# Patient Record
Sex: Female | Born: 1950 | Race: Black or African American | Hispanic: No | Marital: Married | Smoking: Former smoker
Health system: Southern US, Community
[De-identification: ages and names within clinical notes are randomized; demographics above are authoritative.]

## PROBLEM LIST (undated history)

## (undated) DIAGNOSIS — J189 Pneumonia, unspecified organism: Secondary | ICD-10-CM

## (undated) DIAGNOSIS — Z973 Presence of spectacles and contact lenses: Secondary | ICD-10-CM

## (undated) DIAGNOSIS — K219 Gastro-esophageal reflux disease without esophagitis: Secondary | ICD-10-CM

## (undated) DIAGNOSIS — I10 Essential (primary) hypertension: Secondary | ICD-10-CM

## (undated) DIAGNOSIS — E785 Hyperlipidemia, unspecified: Secondary | ICD-10-CM

## (undated) DIAGNOSIS — M5136 Other intervertebral disc degeneration, lumbar region: Secondary | ICD-10-CM

## (undated) DIAGNOSIS — M4316 Spondylolisthesis, lumbar region: Secondary | ICD-10-CM

## (undated) DIAGNOSIS — M069 Rheumatoid arthritis, unspecified: Secondary | ICD-10-CM

## (undated) DIAGNOSIS — Z972 Presence of dental prosthetic device (complete) (partial): Secondary | ICD-10-CM

## (undated) DIAGNOSIS — R011 Cardiac murmur, unspecified: Secondary | ICD-10-CM

## (undated) HISTORY — PX: COLONOSCOPY: SHX174

## (undated) HISTORY — PX: LAPAROSCOPY FOR ECTOPIC PREGNANCY: SUR765

## (undated) HISTORY — PX: THYROID SURGERY: SHX805

## (undated) HISTORY — DX: Hyperlipidemia, unspecified: E78.5

## (undated) HISTORY — PX: MULTIPLE TOOTH EXTRACTIONS: SHX2053

## (undated) HISTORY — PX: TUBAL LIGATION: SHX77

## (undated) HISTORY — PX: BREAST SURGERY: SHX581

## (undated) HISTORY — PX: DIAGNOSTIC LAPAROSCOPY: SUR761

---

## 1997-08-30 ENCOUNTER — Encounter: Admission: RE | Admit: 1997-08-30 | Discharge: 1997-08-30 | Payer: Self-pay | Admitting: *Deleted

## 1997-09-04 ENCOUNTER — Ambulatory Visit: Admission: RE | Admit: 1997-09-04 | Discharge: 1997-09-04 | Payer: Self-pay | Admitting: Internal Medicine

## 1999-09-02 ENCOUNTER — Ambulatory Visit (HOSPITAL_COMMUNITY): Admission: RE | Admit: 1999-09-02 | Discharge: 1999-09-02 | Payer: Self-pay | Admitting: Orthopedic Surgery

## 1999-09-02 ENCOUNTER — Encounter: Payer: Self-pay | Admitting: Orthopedic Surgery

## 1999-09-16 ENCOUNTER — Ambulatory Visit (HOSPITAL_COMMUNITY): Admission: RE | Admit: 1999-09-16 | Discharge: 1999-09-16 | Payer: Self-pay | Admitting: Orthopedic Surgery

## 1999-09-16 ENCOUNTER — Encounter: Payer: Self-pay | Admitting: Orthopedic Surgery

## 1999-10-01 ENCOUNTER — Ambulatory Visit (HOSPITAL_COMMUNITY): Admission: RE | Admit: 1999-10-01 | Discharge: 1999-10-01 | Payer: Self-pay | Admitting: Orthopedic Surgery

## 1999-10-01 ENCOUNTER — Encounter: Payer: Self-pay | Admitting: Orthopedic Surgery

## 2000-04-21 ENCOUNTER — Other Ambulatory Visit: Admission: RE | Admit: 2000-04-21 | Discharge: 2000-04-21 | Payer: Self-pay | Admitting: Family Medicine

## 2000-06-16 ENCOUNTER — Encounter: Payer: Self-pay | Admitting: Family Medicine

## 2000-06-16 ENCOUNTER — Ambulatory Visit (HOSPITAL_COMMUNITY): Admission: RE | Admit: 2000-06-16 | Discharge: 2000-06-16 | Payer: Self-pay | Admitting: Family Medicine

## 2000-12-15 ENCOUNTER — Emergency Department (HOSPITAL_COMMUNITY): Admission: EM | Admit: 2000-12-15 | Discharge: 2000-12-15 | Payer: Self-pay | Admitting: Emergency Medicine

## 2002-09-26 ENCOUNTER — Encounter: Admission: RE | Admit: 2002-09-26 | Discharge: 2002-09-26 | Payer: Self-pay | Admitting: *Deleted

## 2002-09-26 ENCOUNTER — Encounter: Payer: Self-pay | Admitting: *Deleted

## 2002-11-27 ENCOUNTER — Ambulatory Visit (HOSPITAL_COMMUNITY): Admission: RE | Admit: 2002-11-27 | Discharge: 2002-11-27 | Payer: Self-pay | Admitting: Rheumatology

## 2003-06-09 ENCOUNTER — Emergency Department (HOSPITAL_COMMUNITY): Admission: EM | Admit: 2003-06-09 | Discharge: 2003-06-10 | Payer: Self-pay | Admitting: Emergency Medicine

## 2003-09-06 ENCOUNTER — Other Ambulatory Visit: Admission: RE | Admit: 2003-09-06 | Discharge: 2003-09-06 | Payer: Self-pay | Admitting: Family Medicine

## 2003-09-27 ENCOUNTER — Ambulatory Visit (HOSPITAL_COMMUNITY): Admission: RE | Admit: 2003-09-27 | Discharge: 2003-09-27 | Payer: Self-pay | Admitting: Gastroenterology

## 2003-09-27 ENCOUNTER — Encounter (INDEPENDENT_AMBULATORY_CARE_PROVIDER_SITE_OTHER): Payer: Self-pay | Admitting: Specialist

## 2006-10-28 ENCOUNTER — Encounter: Admission: RE | Admit: 2006-10-28 | Discharge: 2006-10-28 | Payer: Self-pay | Admitting: Endocrinology

## 2007-01-10 ENCOUNTER — Encounter: Admission: RE | Admit: 2007-01-10 | Discharge: 2007-01-10 | Payer: Self-pay | Admitting: Surgery

## 2007-03-01 ENCOUNTER — Encounter: Admission: RE | Admit: 2007-03-01 | Discharge: 2007-03-01 | Payer: Self-pay | Admitting: Rheumatology

## 2007-03-07 ENCOUNTER — Encounter (INDEPENDENT_AMBULATORY_CARE_PROVIDER_SITE_OTHER): Payer: Self-pay | Admitting: Surgery

## 2007-03-07 ENCOUNTER — Ambulatory Visit (HOSPITAL_COMMUNITY): Admission: RE | Admit: 2007-03-07 | Discharge: 2007-03-08 | Payer: Self-pay | Admitting: Surgery

## 2008-12-06 ENCOUNTER — Encounter: Admission: RE | Admit: 2008-12-06 | Discharge: 2008-12-06 | Payer: Self-pay | Admitting: Neurosurgery

## 2009-02-21 ENCOUNTER — Ambulatory Visit (HOSPITAL_COMMUNITY): Admission: RE | Admit: 2009-02-21 | Discharge: 2009-02-21 | Payer: Self-pay | Admitting: Gastroenterology

## 2009-08-21 ENCOUNTER — Encounter: Admission: RE | Admit: 2009-08-21 | Discharge: 2009-08-21 | Payer: Self-pay | Admitting: Family Medicine

## 2009-10-21 ENCOUNTER — Other Ambulatory Visit: Admission: RE | Admit: 2009-10-21 | Discharge: 2009-10-21 | Payer: Self-pay | Admitting: Family Medicine

## 2010-02-16 ENCOUNTER — Encounter: Payer: Self-pay | Admitting: Rheumatology

## 2010-06-10 NOTE — Op Note (Signed)
Michelle Castaneda, Michelle Castaneda               ACCOUNT NO.:  [id number]   MEDICAL RECORD NO.:  [id number]          PATIENT TYPE:  AMB   LOCATION:  DAY                          FACILITY:  [facility]   PHYSICIAN:  Velora Heckler, MD      DATE OF BIRTH:  05-Sep-1950   DATE OF PROCEDURE:  03/07/2007  DATE OF DISCHARGE:                               OPERATIVE REPORT   PREOPERATIVE DIAGNOSIS:  Primary hyperparathyroidism.   POSTOPERATIVE DIAGNOSIS:  Primary hyperparathyroidism.   PROCEDURE:  1. Neck exploration.  2. Parathyroidectomy (2-1/2 glands).   SURGEON:  Velora Heckler, MD, FACS   ASSISTANT:  Leonie Man, MD   ANESTHESIA:  General.   ANESTHESIOLOGIST:  Eilene Ghazi, MD   ESTIMATED BLOOD LOSS:  Minimal.   PREPARATION:  Betadine.   COMPLICATIONS:  None.   INDICATIONS:  The patient is a 60 year old black female from [place],  [place].  She is referred by Dr. Dorisann Frames with primary  hyperparathyroidism.  Calcium levels have ranged from 10.2 to 10.9.  Intact parathyroid hormone level has ranged from 104-106.  Twenty-four-  hour urine collection for calcium was obtained and was elevated at 480.  The patient now comes to surgery for parathyroidectomy.  Sestamibi scan  and MRI scan failed to localize adenoma.   BODY OF REPORT:  Procedure was done in OR #11 at the [facility].  The patient was brought to the operating room and  placed in a supine position on the operating room table.  Following the  administration of general anesthesia, the patient is positioned and then  prepped and draped in the usual strict aseptic fashion.  After  ascertaining that an adequate level of anesthesia had been achieved, a  Kocher incision is made with a #15 blade.  Dissection was carried  through subcutaneous tissues and platysma.  Hemostasis was obtained with  the electrocautery.  Skin flaps were elevated cephalad and caudad from  the thyroid notch to the sternal notch.  A  Mahorner self-retaining  retractor was placed for exposure.  Strap muscles were incised in the  midline and dissection was begun on the left side of the neck.  There  was an obvious nodule in the inferior pole of the left thyroid lobe;  this was excised.  Frozen section confirms thyroid tissue.  Further  dissection on the left reveals an abnormally prominent thyrothymic  tract; this was gently dissected out.  There also appears to be a normal  parathyroid gland in the left inferior position.  Exploration of the  left superior position, fails to identify abnormal parathyroid gland.  Area was widely opened including the hypopharynx, the esophagus and all  the way back to the precervical fascia.  Superior pole was skeletonized.  There was a colloid cyst in the superior pole which was ruptured and  drained.   Next, we turned our attention to the right side.  Again, right side  exploration reveals a prominent thyrothymic tract; this was dissected  out and contained what appeared to be an enlarged parathyroid gland.  This was resected between medium  Ligaclips.  It was submitted to  Pathology.  Dr. Esther Hardy confirms parathyroid tissue that appears  hyperplastic, but does not appear to be an adenoma.  Further exploration  along the right neck reveals a right superior gland which is enlarged.  It is just above the level of the inferior thyroid artery in the  tracheoesophageal groove.  It is resected with the vascular pedicle  divided between Ligaclips.  It is submitted to Pathology and again  reveals hyperplastic parathyroid tissue, but without evidence of  adenoma.  A dry pack was placed in the right neck and further  exploration was performed on the left.  Again, no enlarged parathyroid  gland was identified.  However, the nodule in the left inferior position  had become somewhat bruised, consistent with parathyroid tissue.  Decision was made not to biopsy this tissue due to having excised  2  glands from the right.  The thyrothymic tract was excised on the left.  It was opened and there was tissue that appeared to be parathyroid.  This was divided in half and half of the tissue was submitted for frozen  section, which confirms parathyroid tissue which was again hyperplastic.  The remaining tissue was diced into small 1-mm fragments and then  inserted into a muscular cavity and the left sternocleidomastoid muscle.  Muscle was closed over the fragments with an interrupted 3-0 Vicryl  figure-of-eight suture.  Prolene suture was then placed cephalad and  caudad of the closure on the surface of the sternocleidomastoid muscle  for localization purposes at a later date if necessary.  Good hemostasis  was noted throughout the neck.  Surgicel was placed in the operative  field.  Strap muscles were reapproximated in the midline with  interrupted 3-0 Vicryl sutures.  Platysma was closed with interrupted 3-  0 Vicryl sutures.  Skin was closed with a running 4-0 Monocryl  subcuticular suture.  Wound was washed and dried and Benzoin and Steri-  Strips were applied.  Sterile dressings were applied.  The patient was  awakened from anesthesia and brought to the recovery room in stable  condition.  The patient tolerated the procedure well.      Velora Heckler, MD  Electronically Signed     TMG/MEDQ  D:  03/07/2007  T:  03/08/2007  Job:  [id number]   cc:   Dorisann Frames, M.D.  Fax: [phone]   Demetria Pore. Coral Spikes, M.D.  Fax: [phone]   C. Duane Lope, M.D.  Fax: [phone]

## 2010-06-13 NOTE — Op Note (Signed)
Michelle Castaneda, Michelle Castaneda                         ACCOUNT NO.:  [id number]   MEDICAL RECORD NO.:  [id number]                   PATIENT TYPE:  AMB   LOCATION:  ENDO                                 FACILITY:  [facility]   PHYSICIAN:  Danise Edge, M.D.                DATE OF BIRTH:  May 31, 1950   DATE OF PROCEDURE:  09/27/2003  DATE OF DISCHARGE:                                 OPERATIVE REPORT   PROCEDURE:  Colonoscopy and polypectomy.   PROCEDURE INDICATION:  Ms. Michelle Castaneda is a 60 year old female, born  08-24-1950.  Michelle Castaneda has intermittent, painless hematochezia and  is undergoing diagnostic colonoscopy for evaluation.   ENDOSCOPIST:  Danise Edge, M.D.   PREMEDICATION:  1.  Demerol 60 mg.  2.  Versed 6 mg.   DESCRIPTION OF PROCEDURE:  After obtaining informed consent, Michelle Castaneda  was placed in the left lateral decubitus position.  I administered  intravenous Demerol and intravenous Versed to achieve conscious sedation for  the procedure.  The patient's blood pressure, oxygen saturation, and cardiac  rhythm were monitored throughout the procedure and documented in the medical  record.   Anal inspection and digital rectal exam was normal.  The Olympus pediatric  adjustable colonoscope was introduced into the rectum and advanced to the  cecum.  Colonic preparation for the exam today was excellent.   RECTUM:  The rectal mucosa appears normal.  Retroflexed view of the distal  rectum reveals a prominent, nonbleeding internal hemorrhoid which is  probably the source of her intermittent painless hematochezia.  SIGMOID COLON AND DESCENDING COLON:  Normal.  SPLENIC FLEXURE:  Normal.  TRANSVERSE COLON:  From the proximal transverse colon, a 1 mm sessile polyp  was removed with the cold biopsy forceps.  HEPATIC FLEXURE:  Normal.  ASCENDING COLON:  Normal.  CECUM AND ILEOCECAL VALVE:  From the proximal cecum, adjacent to the  appendiceal orifice, a 1 mm sessile polyp was  removed with the cold biopsy  forceps.   ASSESSMENT:  1.  Prominent internal hemorrhoid which is most likely the source of Ms.      Castaneda's intermittent hematochezia.  2.  A small polyp was removed from the proximal transverse colon, and a      small polyp was removed from the cecum.                                               Danise Edge, M.D.    MJ/MEDQ  D:  09/27/2003  T:  09/27/2003  Job:  [id number]   cc:   C. Duane Lope, M.D.  [address]  [place]  [place] [address]  Fax: [phone]   Demetria Pore. Coral Spikes, M.D.  [address]  [address]  [address]  [place] [address]  Fax: [phone]

## 2010-10-17 LAB — URINALYSIS, ROUTINE W REFLEX MICROSCOPIC
Bilirubin Urine: NEGATIVE
Glucose, UA: NEGATIVE
Ketones, ur: NEGATIVE
Nitrite: NEGATIVE
Specific Gravity, Urine: 1.015

## 2010-10-17 LAB — BASIC METABOLIC PANEL
BUN: 16
Chloride: 107
Creatinine, Ser: 0.74
GFR calc Af Amer: >60
GFR calc non Af Amer: >60

## 2010-10-17 LAB — DIFFERENTIAL
Eosinophils Relative: 2
Lymphocytes Relative: 40
Lymphs Abs: 1.6
Monocytes Absolute: 0.3

## 2010-10-17 LAB — CBC
Hemoglobin: 13
MCHC: 34
RDW: 13
WBC: 4.1

## 2011-02-16 ENCOUNTER — Other Ambulatory Visit: Payer: Self-pay | Admitting: Family Medicine

## 2011-02-16 DIAGNOSIS — N644 Mastodynia: Secondary | ICD-10-CM

## 2011-02-23 ENCOUNTER — Ambulatory Visit
Admission: RE | Admit: 2011-02-23 | Discharge: 2011-02-23 | Disposition: A | Discharge status: Home / Self Care | Payer: BC Managed Care – PPO | Source: Ambulatory Visit | Attending: Family Medicine | Admitting: Family Medicine

## 2011-02-23 DIAGNOSIS — N644 Mastodynia: Secondary | ICD-10-CM

## 2012-11-14 ENCOUNTER — Other Ambulatory Visit: Payer: Self-pay

## 2012-11-14 DIAGNOSIS — Z1231 Encounter for screening mammogram for malignant neoplasm of breast: Secondary | ICD-10-CM

## 2012-11-16 ENCOUNTER — Ambulatory Visit
Admission: RE | Admit: 2012-11-16 | Discharge: 2012-11-16 | Disposition: A | Discharge status: Home / Self Care | Payer: BC Managed Care – PPO | Source: Ambulatory Visit

## 2012-11-16 DIAGNOSIS — Z1231 Encounter for screening mammogram for malignant neoplasm of breast: Secondary | ICD-10-CM

## 2013-03-06 ENCOUNTER — Other Ambulatory Visit: Payer: Self-pay | Admitting: Family Medicine

## 2013-03-06 ENCOUNTER — Other Ambulatory Visit (HOSPITAL_COMMUNITY)
Admission: RE | Admit: 2013-03-06 | Discharge: 2013-03-06 | Disposition: A | Discharge status: Home / Self Care | Payer: BC Managed Care – PPO | Source: Ambulatory Visit | Attending: Family Medicine | Admitting: Family Medicine

## 2013-03-06 DIAGNOSIS — Z01419 Encounter for gynecological examination (general) (routine) without abnormal findings: Secondary | ICD-10-CM | POA: Insufficient documentation

## 2013-11-07 ENCOUNTER — Other Ambulatory Visit: Payer: Self-pay

## 2013-11-07 DIAGNOSIS — Z1231 Encounter for screening mammogram for malignant neoplasm of breast: Secondary | ICD-10-CM

## 2013-11-21 ENCOUNTER — Ambulatory Visit
Admission: RE | Admit: 2013-11-21 | Discharge: 2013-11-21 | Disposition: A | Discharge status: Home / Self Care | Payer: BC Managed Care – PPO | Source: Ambulatory Visit

## 2013-11-21 ENCOUNTER — Encounter (INDEPENDENT_AMBULATORY_CARE_PROVIDER_SITE_OTHER): Payer: Self-pay

## 2013-11-21 DIAGNOSIS — Z1231 Encounter for screening mammogram for malignant neoplasm of breast: Secondary | ICD-10-CM

## 2014-10-03 DIAGNOSIS — M2550 Pain in unspecified joint: Secondary | ICD-10-CM | POA: Diagnosis not present

## 2014-10-03 DIAGNOSIS — M0579 Rheumatoid arthritis with rheumatoid factor of multiple sites without organ or systems involvement: Secondary | ICD-10-CM | POA: Diagnosis not present

## 2014-10-03 DIAGNOSIS — Z79899 Other long term (current) drug therapy: Secondary | ICD-10-CM | POA: Diagnosis not present

## 2015-01-02 DIAGNOSIS — M0579 Rheumatoid arthritis with rheumatoid factor of multiple sites without organ or systems involvement: Secondary | ICD-10-CM | POA: Diagnosis not present

## 2015-01-02 DIAGNOSIS — M2550 Pain in unspecified joint: Secondary | ICD-10-CM | POA: Diagnosis not present

## 2015-01-02 DIAGNOSIS — Z79899 Other long term (current) drug therapy: Secondary | ICD-10-CM | POA: Diagnosis not present

## 2015-01-02 DIAGNOSIS — M25431 Effusion, right wrist: Secondary | ICD-10-CM | POA: Diagnosis not present

## 2015-01-22 ENCOUNTER — Other Ambulatory Visit: Payer: Self-pay

## 2015-01-22 DIAGNOSIS — Z1231 Encounter for screening mammogram for malignant neoplasm of breast: Secondary | ICD-10-CM

## 2015-02-05 ENCOUNTER — Ambulatory Visit
Admission: RE | Admit: 2015-02-05 | Discharge: 2015-02-05 | Disposition: A | Discharge status: Home / Self Care | Payer: Medicare Other | Source: Ambulatory Visit

## 2015-02-05 DIAGNOSIS — Z1231 Encounter for screening mammogram for malignant neoplasm of breast: Secondary | ICD-10-CM

## 2015-02-06 ENCOUNTER — Other Ambulatory Visit: Payer: Self-pay | Admitting: Family Medicine

## 2015-02-06 DIAGNOSIS — R928 Other abnormal and inconclusive findings on diagnostic imaging of breast: Secondary | ICD-10-CM

## 2015-02-08 ENCOUNTER — Ambulatory Visit
Admission: RE | Admit: 2015-02-08 | Discharge: 2015-02-08 | Disposition: A | Discharge status: Home / Self Care | Payer: Medicare Other | Source: Ambulatory Visit | Attending: Family Medicine | Admitting: Family Medicine

## 2015-02-08 ENCOUNTER — Other Ambulatory Visit: Payer: Self-pay | Admitting: Family Medicine

## 2015-02-08 DIAGNOSIS — R921 Mammographic calcification found on diagnostic imaging of breast: Secondary | ICD-10-CM

## 2015-02-08 DIAGNOSIS — R928 Other abnormal and inconclusive findings on diagnostic imaging of breast: Secondary | ICD-10-CM

## 2015-02-11 ENCOUNTER — Other Ambulatory Visit: Payer: Self-pay | Admitting: Family Medicine

## 2015-02-11 ENCOUNTER — Ambulatory Visit
Admission: RE | Admit: 2015-02-11 | Discharge: 2015-02-11 | Disposition: A | Discharge status: Home / Self Care | Payer: Medicare Other | Source: Ambulatory Visit | Attending: Family Medicine | Admitting: Family Medicine

## 2015-02-11 DIAGNOSIS — R921 Mammographic calcification found on diagnostic imaging of breast: Secondary | ICD-10-CM

## 2015-04-02 ENCOUNTER — Other Ambulatory Visit: Payer: Self-pay | Admitting: Obstetrics and Gynecology

## 2015-04-02 ENCOUNTER — Other Ambulatory Visit (HOSPITAL_COMMUNITY)
Admission: RE | Admit: 2015-04-02 | Discharge: 2015-04-02 | Disposition: A | Discharge status: Home / Self Care | Payer: Medicare Other | Source: Ambulatory Visit | Attending: Obstetrics and Gynecology | Admitting: Obstetrics and Gynecology

## 2015-04-02 DIAGNOSIS — Z1151 Encounter for screening for human papillomavirus (HPV): Secondary | ICD-10-CM | POA: Diagnosis present

## 2015-04-02 DIAGNOSIS — Z01419 Encounter for gynecological examination (general) (routine) without abnormal findings: Secondary | ICD-10-CM | POA: Insufficient documentation

## 2015-04-03 LAB — CYTOLOGY - PAP

## 2015-04-24 ENCOUNTER — Other Ambulatory Visit: Payer: Self-pay | Admitting: Obstetrics and Gynecology

## 2015-05-07 NOTE — Patient Instructions (Addendum)
Your procedure is scheduled on:  Thursday, May 30, 2015  Enter through the [employer] of [facility] at: 9:00 AM  Pick up the phone at the desk and dial [phone].  Call this number if you have problems the morning of surgery: [phone].  Remember: Do NOT eat food or drink after: Midnight Wednesday, May 29, 2015  Take these medicines the morning of surgery with a SIP OF WATER:  Hydroxychloroquine, Leflunomide  Do NOT wear jewelry (body piercing), metal hair clips/bobby pins, make-up, or nail polish. Do NOT wear lotions, powders, or perfumes.  You may wear deodorant. Do NOT shave for 48 hours prior to surgery. Do NOT bring valuables to the hospital. Contacts, dentures, or bridgework may not be worn into surgery.  Have a responsible adult drive you home and stay with you for 24 hours after your procedure

## 2015-05-08 ENCOUNTER — Other Ambulatory Visit (HOSPITAL_COMMUNITY): Payer: Self-pay | Admitting: Obstetrics and Gynecology

## 2015-05-09 ENCOUNTER — Encounter (HOSPITAL_COMMUNITY)
Admission: RE | Admit: 2015-05-09 | Discharge: 2015-05-09 | Disposition: A | Discharge status: Home / Self Care | Payer: Medicare Other | Source: Ambulatory Visit | Attending: Obstetrics and Gynecology | Admitting: Obstetrics and Gynecology

## 2015-05-09 ENCOUNTER — Other Ambulatory Visit: Payer: Self-pay

## 2015-05-09 ENCOUNTER — Encounter (HOSPITAL_COMMUNITY): Payer: Self-pay

## 2015-05-09 DIAGNOSIS — Z01818 Encounter for other preprocedural examination: Secondary | ICD-10-CM | POA: Diagnosis present

## 2015-05-09 HISTORY — DX: Other intervertebral disc degeneration, lumbar region: M51.36

## 2015-05-09 HISTORY — DX: Cardiac murmur, unspecified: R01.1

## 2015-05-09 HISTORY — DX: Gastro-esophageal reflux disease without esophagitis: K21.9

## 2015-05-09 HISTORY — DX: Rheumatoid arthritis, unspecified: M06.9

## 2015-05-09 LAB — CBC
HCT: 41.6 % (ref 36.0–46.0)
HEMOGLOBIN: 13.7 g/dL (ref 12.0–15.0)
MCH: 29.3 pg (ref 26.0–34.0)
MCHC: 32.9 g/dL (ref 30.0–36.0)
MCV: 88.9 fL (ref 78.0–100.0)
Platelets: 171 10*3/uL (ref 150–400)
RBC: 4.68 MIL/uL (ref 3.87–5.11)
RDW: 13.2 % (ref 11.5–15.5)
WBC: 3.6 10*3/uL — ABNORMAL LOW (ref 4.0–10.5)

## 2015-05-09 NOTE — Pre-Procedure Instructions (Signed)
Dr. Cristela Blue reviewed EKG and informed of patient's elevated blood pressures, no new orders received at this time.

## 2015-05-30 ENCOUNTER — Encounter (HOSPITAL_COMMUNITY)
Admission: RE | Disposition: A | Discharge status: Home / Self Care | Payer: Self-pay | Source: Ambulatory Visit | Attending: Obstetrics and Gynecology

## 2015-05-30 ENCOUNTER — Ambulatory Visit (HOSPITAL_COMMUNITY)
Admission: RE | Admit: 2015-05-30 | Discharge: 2015-05-30 | Disposition: A | Discharge status: Home / Self Care | Payer: Medicare Other | Source: Ambulatory Visit | Attending: Obstetrics and Gynecology | Admitting: Obstetrics and Gynecology

## 2015-05-30 ENCOUNTER — Other Ambulatory Visit (HOSPITAL_COMMUNITY): Payer: Self-pay | Admitting: Obstetrics and Gynecology

## 2015-05-30 ENCOUNTER — Encounter (HOSPITAL_COMMUNITY): Payer: Self-pay | Admitting: Registered Nurse

## 2015-05-30 ENCOUNTER — Ambulatory Visit (HOSPITAL_COMMUNITY): Payer: Medicare Other | Admitting: Anesthesiology

## 2015-05-30 DIAGNOSIS — Z87891 Personal history of nicotine dependence: Secondary | ICD-10-CM | POA: Diagnosis not present

## 2015-05-30 DIAGNOSIS — N950 Postmenopausal bleeding: Secondary | ICD-10-CM | POA: Diagnosis present

## 2015-05-30 DIAGNOSIS — N840 Polyp of corpus uteri: Secondary | ICD-10-CM | POA: Diagnosis not present

## 2015-05-30 DIAGNOSIS — K219 Gastro-esophageal reflux disease without esophagitis: Secondary | ICD-10-CM | POA: Diagnosis not present

## 2015-05-30 DIAGNOSIS — Z6832 Body mass index (BMI) 32.0-32.9, adult: Secondary | ICD-10-CM | POA: Diagnosis not present

## 2015-05-30 HISTORY — PX: CERVICAL POLYPECTOMY: SHX88

## 2015-05-30 HISTORY — PX: DILATATION & CURETTAGE/HYSTEROSCOPY WITH MYOSURE: SHX6511

## 2015-05-30 SURGERY — DILATATION & CURETTAGE/HYSTEROSCOPY WITH MYOSURE
Anesthesia: General

## 2015-05-30 MED ORDER — BUPIVACAINE HCL (PF) 0.25 % IJ SOLN
INTRAMUSCULAR | Status: AC
  Filled 2015-05-30: qty 30

## 2015-05-30 MED ORDER — MIDAZOLAM HCL 2 MG/2ML IJ SOLN
0.5000 mg | Freq: Once | INTRAMUSCULAR | Status: DC | PRN
Start: 2015-05-30 — End: 2015-05-30

## 2015-05-30 MED ORDER — LACTATED RINGERS IV SOLN
INTRAVENOUS | Status: DC
  Administered 2015-05-30 (×2): via INTRAVENOUS

## 2015-05-30 MED ORDER — KETOROLAC TROMETHAMINE 30 MG/ML IJ SOLN
INTRAMUSCULAR | Status: DC | PRN
  Administered 2015-05-30: 30 mg via INTRAVENOUS

## 2015-05-30 MED ORDER — PROPOFOL 10 MG/ML IV BOLUS
INTRAVENOUS | Status: DC | PRN
  Administered 2015-05-30: 150 mg via INTRAVENOUS

## 2015-05-30 MED ORDER — SCOPOLAMINE 1 MG/3DAYS TD PT72: 1.0000 | MEDICATED_PATCH | Freq: Once | TRANSDERMAL | Status: DC

## 2015-05-30 MED ORDER — FENTANYL CITRATE (PF) 100 MCG/2ML IJ SOLN
INTRAMUSCULAR | Status: AC
  Filled 2015-05-30: qty 2

## 2015-05-30 MED ORDER — DEXAMETHASONE SODIUM PHOSPHATE 4 MG/ML IJ SOLN
INTRAMUSCULAR | Status: AC
Start: 2015-05-30 — End: 2015-05-30
  Filled 2015-05-30: qty 1

## 2015-05-30 MED ORDER — ONDANSETRON HCL 4 MG/2ML IJ SOLN
INTRAMUSCULAR | Status: AC
  Filled 2015-05-30: qty 2

## 2015-05-30 MED ORDER — MIDAZOLAM HCL 2 MG/2ML IJ SOLN
INTRAMUSCULAR | Status: AC
  Filled 2015-05-30: qty 2

## 2015-05-30 MED ORDER — IBUPROFEN 800 MG PO TABS: 800.0000 mg | ORAL_TABLET | Freq: Three times a day (TID) | ORAL | Status: AC | PRN

## 2015-05-30 MED ORDER — MEPERIDINE HCL 25 MG/ML IJ SOLN: 6.2500 mg | INTRAMUSCULAR | Status: DC | PRN

## 2015-05-30 MED ORDER — PROPOFOL 10 MG/ML IV BOLUS
INTRAVENOUS | Status: AC
  Filled 2015-05-30: qty 20

## 2015-05-30 MED ORDER — HYDROMORPHONE HCL 1 MG/ML IJ SOLN: 0.2500 mg | INTRAMUSCULAR | Status: DC | PRN

## 2015-05-30 MED ORDER — OXYCODONE-ACETAMINOPHEN 5-325 MG PO TABS: 1.0000 | ORAL_TABLET | ORAL | Status: DC | PRN

## 2015-05-30 MED ORDER — LIDOCAINE HCL (CARDIAC) 20 MG/ML IV SOLN
INTRAVENOUS | Status: AC
  Filled 2015-05-30: qty 5

## 2015-05-30 MED ORDER — SODIUM CHLORIDE 0.9 % IR SOLN
Status: DC | PRN
  Administered 2015-05-30: 3000 mL

## 2015-05-30 MED ORDER — FENTANYL CITRATE (PF) 100 MCG/2ML IJ SOLN
INTRAMUSCULAR | Status: DC | PRN
  Administered 2015-05-30: 25 ug via INTRAVENOUS
  Administered 2015-05-30: 50 ug via INTRAVENOUS
  Administered 2015-05-30: 25 ug via INTRAVENOUS

## 2015-05-30 MED ORDER — BUPIVACAINE HCL (PF) 0.25 % IJ SOLN
INTRAMUSCULAR | Status: DC | PRN
  Administered 2015-05-30: 20 mL

## 2015-05-30 MED ORDER — ONDANSETRON HCL 4 MG/2ML IJ SOLN
INTRAMUSCULAR | Status: DC | PRN
  Administered 2015-05-30: 4 mg via INTRAVENOUS

## 2015-05-30 MED ORDER — DEXAMETHASONE SODIUM PHOSPHATE 4 MG/ML IJ SOLN
INTRAMUSCULAR | Status: DC | PRN
  Administered 2015-05-30: 4 mg via INTRAVENOUS

## 2015-05-30 MED ORDER — LIDOCAINE HCL (CARDIAC) 20 MG/ML IV SOLN
INTRAVENOUS | Status: DC | PRN
  Administered 2015-05-30: 50 mg via INTRAVENOUS

## 2015-05-30 MED ORDER — MIDAZOLAM HCL 5 MG/5ML IJ SOLN
INTRAMUSCULAR | Status: DC | PRN
  Administered 2015-05-30: 2 mg via INTRAVENOUS

## 2015-05-30 MED ORDER — KETOROLAC TROMETHAMINE 30 MG/ML IJ SOLN
INTRAMUSCULAR | Status: AC
  Filled 2015-05-30: qty 1

## 2015-05-30 SURGICAL SUPPLY — 22 items
CANISTER SUCT 3000ML (MISCELLANEOUS) ×2 IMPLANT
CATH ROBINSON RED A/P 16FR (CATHETERS) ×2 IMPLANT
CLOTH BEACON ORANGE TIMEOUT ST (SAFETY) ×2 IMPLANT
CONTAINER PREFILL 10% NBF 60ML (FORM) ×3 IMPLANT
DEVICE MYOSURE LITE (MISCELLANEOUS) IMPLANT
DEVICE MYOSURE REACH (MISCELLANEOUS) ×1 IMPLANT
ELECT REM PT RETURN 9FT ADLT (ELECTROSURGICAL) ×2
ELECTRODE REM PT RTRN 9FT ADLT (ELECTROSURGICAL) ×1 IMPLANT
FILTER ARTHROSCOPY CONVERTOR (FILTER) ×2 IMPLANT
GLOVE BIOGEL M 6.5 STRL (GLOVE) ×4 IMPLANT
GLOVE BIOGEL PI IND STRL 6.5 (GLOVE) ×1 IMPLANT
GLOVE BIOGEL PI IND STRL 7.0 (GLOVE) ×1 IMPLANT
GLOVE BIOGEL PI INDICATOR 6.5 (GLOVE) ×1
GLOVE BIOGEL PI INDICATOR 7.0 (GLOVE) ×1
GOWN STRL REUS W/TWL LRG LVL3 (GOWN DISPOSABLE) ×4 IMPLANT
PACK VAGINAL MINOR WOMEN LF (CUSTOM PROCEDURE TRAY) ×2 IMPLANT
PAD OB MATERNITY 4.3X12.25 (PERSONAL CARE ITEMS) ×2 IMPLANT
SEAL ROD LENS SCOPE MYOSURE (ABLATOR) ×2 IMPLANT
TOWEL OR 17X24 6PK STRL BLUE (TOWEL DISPOSABLE) ×4 IMPLANT
TUBING AQUILEX INFLOW (TUBING) ×2 IMPLANT
TUBING AQUILEX OUTFLOW (TUBING) ×2 IMPLANT
WATER STERILE IRR 1000ML POUR (IV SOLUTION) ×1 IMPLANT

## 2015-05-30 NOTE — Discharge Instructions (Signed)

## 2015-05-30 NOTE — H&P (Signed)
Chief Complaint(s):   PreOp for 05/30/15   HPI:  General 65 y/o presents for preoperative history and physical in preparation for hysteroscopy D&C and possible polypectomy to evaluate and treat postmenopsuasl bleeding. Her ultrasound showed a 7.01x 4.2 xcm x 4.8 cm uterus the endometrium is thickened 1.87 cm. she has a submuocal fibroid that is 2.3 cm and an anterior left fiborid that is 2.8 cm. sh ehas had vaginal discharge that was bloody for several weeks. It is intermittnet. Endometrial biopsy performed 04/24/2015 revealed benign endometrial polyps. She denies pelvic pain. she has never had this before. she is sexually active no postcoital bleeding. she denies any discharge prior to thursday.  Current Medication:  Taking  Multivitamins Capsule as directed Orally     Leflunomide 20 MG Tablet 1 tab Orally Once a day, Notes: [place]     Hydroxychloroquine Sulfate 200 MG Tablet 1 tablet with food or milk Orally Once a day     B Complex - Capsule as directed Orally     Red Yeast Rice 600 MG Capsule Orally     Ibuprofen 800 MG Tablet 1 tablet Orally TID, Notes: prn     Medication List reviewed and reconciled with the patient   Medical History:   rheumatoid arthritis--RF+, CCP++, mild elevated ESR, clinical synovitis of hands/wrists, feet, leucopenia. MTX stopped for low WBC, remained low off all medications , Dr Trudie Reed     Parathyroidectomy     bicuspid aortic valve      Allergies/Intolerance:   Aspirin     Naproxen     shellfish - hives   Gyn History:   Sexual activity currently sexually active. Periods : postmenopausal, but recent spotting. LMP Mar 28, 2001. Last pap smear date 04/02/15. Last mammogram date 02/05/15. Denies Abnormal pap smear. Denies STD.   OB History:   Number of pregnancies 4. miscarriages 1. Pregnancy # 1 live birth, vaginal delivery, boy. Pregnancy # 2 live birth, vaginal delivery, boy. Pregnancy # 3 live birth, vaginal delivery, boy. Pregnancy # 4: miscarriage, tubal.     Surgical History:   thyroid nodule removed     Bx right breast - normal 02/11/2015   Hospitalization:   childbirth x 3     thyroid surgery   Family History:   Father: deceased, alcoholism    Mother: alive 25 yrs, osteoarthritis, hypercholesterolemia, diagnosed with DM, HTN, CAD    Paternal [place] Father: deceased    Paternal Eunice Mother: deceased    Maternal Grand Father: deceased, ADHD    Maternal Grand Mother: deceased    Brother 1: deceased 51 yrs, emphysema, diagnosed with COPD    Brother2: alive    Brother 3: deceased, MVA    7 brother(s) . 3 son(s) .    Significant GI family history: None Brother #4 deceased lung cancer 2014/03/28 Brother #5 alive Brother #6 deceased, bone cancer Brother #7 alive.  Social History:  General Tobacco use cigarettes: Former smoker, Quit in year 28-Mar-1998, Tobacco history last updated 05/20/2015.  no EXPOSURE TO PASSIVE SMOKE.  Alcohol: yes, Rare.  Caffeine: yes, coffee, tea, 2 servings daily.  no Recreational drug use, no.  no DIET, no particular dietary program, healthy choices.  Exercise: yes, tries to walk , less keeping GGChildren.  DENTAL CARE: good.  Marital Status: married, Married.  Children: 3 sons.  OCCUPATION: unemployed, retired.... .  Retired in June 2013 but wanting to go back to work, 6 [employer], 1 [place] none" options="no,yes" propid="91" itemid="[id number]" categoryid="10464" encounterid="[id number]"Fatigue none. none  today" options="no,yes" propid="91" itemid="10467" categoryid="10464" encounterid="[id number]"Fever none today.  CARDIOLOGY none" options="no,yes" propid="91" itemid="193603" categoryid="10488" encounterid="[id number]"Chest pain none.  RESPIRATORY no" options="no" propid="91" itemid="270013" categoryid="138132" encounterid="[id number]"Shortness of breath no. no" options="no,yes" propid="91" itemid="172745" categoryid="138132" encounterid="[id number]"Cough no.  GASTROENTEROLOGY none" options="no,yes"  propid="91" itemid="193447" categoryid="10494" encounterid="[id number]"Appetite change none. no" options="no,yes" propid="91" itemid="193449" categoryid="10494" encounterid="[id number]"Change in bowel habits no.  FEMALE REPRODUCTIVE no" options="no,yes" propid="91" itemid="196298" categoryid="10525" encounterid="[id number]"Breast lumps or discharge no. none" options="no,yes" propid="91" itemid="186083" categoryid="10525" encounterid="[id number]"Breast pain none. none" options="no,yes" propid="91" itemid="138198" categoryid="10525" encounterid="[id number]"Dyspareunia none. no" options="no,yes" propid="91" itemid="202654" categoryid="10525" encounterid="[id number]"Dysuria no. none" options="no,yes" propid="91" itemid="186082" categoryid="10525" encounterid="[id number]"Pelvic pain none. NA" options="no,yes" propid="91" itemid="199173" categoryid="10525" encounterid="[id number]"Regular menses NA. no" options="no,yes" propid="91" itemid="278230" categoryid="10525" encounterid="[id number]"Unusual vaginal discharge no. no" options="no,yes" propid="91" itemid="278942" categoryid="10525" encounterid="[id number]"Vaginal itching no. no" options="no,yes" propid="91" itemid="278837" categoryid="10525" encounterid="[id number]"Vulvar/labial lesion no.  NEUROLOGY none" options="no,yes" propid="91" itemid="193627" categoryid="12512" encounterid="[id number]"Migraines none. none" options="no,yes" propid="91" itemid="12514" categoryid="12512" encounterid="[id number]"Tingling/numbness none. none" options="no,yes" propid="91" itemid="193467" categoryid="12512" encounterid="[id number]"Visual changes none.  PSYCHOLOGY no" options="" propid="91" itemid="275919" categoryid="10520" encounterid="[id number]"Depression no.  SKIN no" options="no,yes" propid="91" itemid="269383" categoryid="202750" encounterid="[id number]"Rash no. no" options="no,yes" propid="91" itemid="202757" categoryid="202750" encounterid="[id number]"Suspicious lesions no.  ENDOCRINOLOGY none" options="no,yes"  propid="91" itemid="202624" categoryid="12508" encounterid="[id number]"Hot flashes none. no unintentional" options="no,yes" propid="91" itemid="193436" categoryid="12508" encounterid="[id number]"Weight gain no unintentional. none" options="no,yes" propid="91" itemid="138164" categoryid="12508" encounterid="[id number]"Weight loss none.  HEMATOLOGY/LYMPH no" options="no,yes" propid="91" itemid="193454" categoryid="138157" encounterid="[id number]"Anemia no.    Objective: Vitals:  Wt 199, Wt change 1 lb, Pulse sitting 70, BP sitting 133/77  Past Results:  Examination:  General Examination McCoy,Tiffany 05/20/2015 10:12:30 AM &gt; , for pelvic exam only" categoryPropId="[id number]" examid="[id number]"CHAPERONE PRESENT McCoy,Tiffany 05/20/2015 10:12:30 AM > , for pelvic exam only.  Physical Examination: GENERAL in NAD, pleasant"Patient appears in NAD, pleasant. well developed"Build: well developed. overweight"General Appearance: overweight. african-american"Race: african-american.  NECK unremarkable, no lymphadenopathy"Cervical lymph nodes: unremarkable, no lymphadenopathy. normal"ROM: normal. no thyromegaly, non tender"Thyroid: no thyromegaly, non tender.  LUNGS clear to auscultation"Breath sounds: clear to auscultation. no"Dyspnea: no.  HEART none"Murmurs: none. normal"Rate: normal. regular"Rhythm: regular.  ABDOMEN no masses,tenderness,organomegaly, no CVAT"General: no masses,tenderness,organomegaly, no CVAT.  FEMALE GENITOURINARY no mass, non tender"Adnexa: no mass, non tender. normal, no lesions"Anus/perineum: normal, no lesions. normal appearance , no lesions/discharge/bleeding, , good pelvic support , external os normal "Cervix/ cuff: normal appearance , no lesions/discharge/bleeding, , good pelvic support , external os normal . normal, no lesions, no skin discoloration, no lymphadenopathy"External genitalia: normal, no lesions, no skin discoloration, no lymphadenopathy. normal external meatus"Urethra: normal  external meatus. normal size/shape/consistency, freely mobile, non tender"Uterus: normal size/shape/consistency, freely mobile, non tender. deferred"Rectum: deferred. pink/moist mucosa, no lesions, no abnormal discharge, odorless"Vagina: pink/moist mucosa, no lesions, no abnormal discharge, odorless. normal, no lesions, no skin discoloration, non tender"Vulva: normal, no lesions, no skin discoloration, non tender.  EXTREMITIES FROM of all extremities"Extremities FROM of all extremities.  NEUROLOGICAL normal"Gait: normal. alert and oriented x 3"Orientation: alert and oriented x 3.    Assessment: Assessment:  Postmenopausal bleeding - N95.0 (Primary)     Thickened endometrium - R93.8     Endometrial polyp - N84.0     Plan: Treatment:  Postmenopausal bleeding  Notes: hystereoscopy D&C possible polypectomy. Marland Kitchen rb/a of surgery discussed with the patient including but not limited to infection bleeding perforation of the uterus with the need for further surgery. pt voiced understanding and desires to proceed.  Thickened endometrium  Notes: hystereoscopy D&C possible polypectomy. Marland Kitchen rb/a of surgery discussed with the patient including but not limited to infection bleeding perforation of the uterus with the need for further surgery. pt voiced understanding and desires to proceed.  Endometrial polyp  Notes: hystereoscopy  D&C possible polypectomy. Marland Kitchen rb/a of surgery discussed with the patient including but not limited to infection bleeding perforation of the uterus with the need for further surgery. pt voiced understanding and desires to proceed

## 2015-05-30 NOTE — Anesthesia Postprocedure Evaluation (Signed)
Anesthesia Post Note  Patient: Michelle Castaneda  Procedure(s) Performed: Procedure(s) (LRB): DILATATION & CURETTAGE/HYSTEROSCOPY WITH MYOSURE (N/A) POLYPECTOMY (N/A)  Patient location during evaluation: PACU Anesthesia Type: General Level of consciousness: awake and alert, oriented and patient cooperative Pain management: pain level controlled Vital Signs Assessment: post-procedure vital signs reviewed and stable Respiratory status: spontaneous breathing, nonlabored ventilation and respiratory function stable Cardiovascular status: blood pressure returned to baseline and stable Postop Assessment: no signs of nausea or vomiting Anesthetic complications: no     Last Vitals:  Filed Vitals:   05/30/15 1215 05/30/15 1245  BP: 151/77 155/80  Pulse: 57 61  Temp: 36.6 C 37 C  Resp: 13 14    Last Pain: There were no vitals filed for this visit. Pain Goal:                 Bryssa Tones,E. Jaedah Lords

## 2015-05-30 NOTE — Transfer of Care (Signed)
Immediate Anesthesia Transfer of Care Note  Patient: Michelle Castaneda  Procedure(s) Performed: Procedure(s): DILATATION & CURETTAGE/HYSTEROSCOPY WITH MYOSURE (N/A) POLYPECTOMY (N/A)  Patient Location: PACU  Anesthesia Type:General  Level of Consciousness: awake, alert  and oriented  Airway & Oxygen Therapy: Patient Spontanous Breathing and Patient connected to nasal cannula oxygen  Post-op Assessment: Report given to RN and Post -op Vital signs reviewed and stable  Post vital signs: Reviewed and stable  Last Vitals:  Filed Vitals:   05/30/15 0912  BP: 145/86  Pulse: 64  Temp: 36.7 C  Resp: 20    Last Pain: There were no vitals filed for this visit.       Complications: No apparent anesthesia complications

## 2015-05-30 NOTE — Op Note (Signed)
05/30/2015  11:37 AM  PATIENT:  Michelle Castaneda  65 y.o. female  PRE-OPERATIVE DIAGNOSIS:  Endometrial Polyp POST MENOPAUSAL BLEEDING   POST-OPERATIVE DIAGNOSIS:  Endometrial Polyp POST MENOPAUSAL BLEEDING  PROCEDURE:  Procedure(s): DILATATION & CURETTAGE/HYSTEROSCOPY WITH MYOSURE (N/A) POLYPECTOMY (N/A)  SURGEON:  Surgeon(s) and Role:    * Gerald Leitz, MD - Primary  PHYSICIAN ASSISTANT:   ASSISTANTS: none   ANESTHESIA:   general  EBL:  Total I/O In: 1300 [I.V.:1300] Out: 5 [Blood:5]  BLOOD ADMINISTERED:none  DRAINS: none   LOCAL MEDICATIONS USED:  MARCAINE     SPECIMEN:  Source of Specimen:  endometrial polyps and currettings   DISPOSITION OF SPECIMEN:  PATHOLOGY  COUNTS:  YES  TOURNIQUET:  * No tourniquets in log *  DICTATION: .Dragon Dictation  PLAN OF CARE: Discharge to home after PACU  PATIENT DISPOSITION:  PACU - hemodynamically stable.   Delay start of Pharmacological VTE agent (>24hrs) due to surgical blood loss or risk of bleeding: not applicable  Findings: multiple endometrial polyps  Saline deficit 425 cc.  Procedure: Patient was taken to the operating room where she was placed under general anesthesia. She was placed in the dorsal lithotomy position. She was prepped and draped in the usual sterile fashion. A speculum was placed into the vaginal vault. The anterior lip of the cervix was grasped with a single-tooth tenaculum.10 cc of  Quarter percent Marcaine was injected at the 4 and 8:00 positions of the cervix. The cervix was then sounded to 7 cm. The cervix was dilated to approximately 6 mm. Myosure  hysteroscope was inserted. The findings noted above.   The polyp was resected with myosure . The hysteroscop was removed. A Sharp curet was introduced and endometrial curettings were obtained. The hysteroscope was then reinserted. There was no evidence of endometrial polyps or masses with reinsertion of the hysteroscope. There was no evidence of perforation.  Hysteroscope was then removed. The single-tooth tenaculum was removed from the anterior lip of the cervix. Excellent hemostasis was noted. The speculum was removed from the patient's vagina. She was awakened from anesthesia taken care to the recovery room awake and in stable condition. Sponge lap and needle counts were correct x2.

## 2015-05-30 NOTE — Anesthesia Procedure Notes (Signed)
Procedure Name: LMA Insertion Date/Time: 05/30/2015 10:39 AM Performed by: Jhonnie Garner Pre-anesthesia Checklist: Patient identified, Emergency Drugs available, Suction available and Patient being monitored Patient Re-evaluated:Patient Re-evaluated prior to inductionOxygen Delivery Method: Circle system utilized Preoxygenation: Pre-oxygenation with 100% oxygen Intubation Type: IV induction Ventilation: Mask ventilation without difficulty LMA: LMA inserted LMA Size: 4.0 Number of attempts: 1 Placement Confirmation: positive ETCO2 and breath sounds checked- equal and bilateral Tube secured with: Tape Dental Injury: Teeth and Oropharynx as per pre-operative assessment

## 2015-05-30 NOTE — Anesthesia Preprocedure Evaluation (Addendum)
Anesthesia Evaluation  Patient identified by MRN, date of birth, ID band Patient awake    Reviewed: Allergy & Precautions, NPO status , Patient's Chart, lab work & pertinent test results  History of Anesthesia Complications Negative for: history of anesthetic complications  Airway Mallampati: II       Dental  (+) Edentulous Upper, Edentulous Lower   Pulmonary former smoker,    breath sounds clear to auscultation       Cardiovascular (-) anginanegative cardio ROS   Rhythm:Regular Rate:Normal     Neuro/Psych DDD    GI/Hepatic Neg liver ROS, GERD  Medicated and Controlled,  Endo/Other  Morbid obesity  Renal/GU negative Renal ROS     Musculoskeletal  (+) Arthritis , Rheumatoid disorders,    Abdominal   Peds  Hematology negative hematology ROS (+)   Anesthesia Other Findings   Reproductive/Obstetrics                         Anesthesia Physical Anesthesia Plan  ASA: II  Anesthesia Plan: General   Post-op Pain Management:    Induction: Intravenous  Airway Management Planned: LMA  Additional Equipment:   Intra-op Plan:   Post-operative Plan: Extubation in OR  Informed Consent: I have reviewed the patients History and Physical, chart, labs and discussed the procedure including the risks, benefits and alternatives for the proposed anesthesia with the patient or authorized representative who has indicated his/her understanding and acceptance.     Plan Discussed with: CRNA and Surgeon  Anesthesia Plan Comments: (Plan routine monitors, GA, LMA OK)       Anesthesia Quick Evaluation

## 2015-05-31 ENCOUNTER — Encounter (HOSPITAL_COMMUNITY): Payer: Self-pay | Admitting: Obstetrics and Gynecology

## 2016-06-10 ENCOUNTER — Telehealth: Payer: Self-pay

## 2016-06-10 NOTE — Telephone Encounter (Signed)
SENT NOTES TO SCHEDULING 

## 2016-06-11 ENCOUNTER — Telehealth: Payer: Self-pay | Admitting: Cardiovascular Disease

## 2016-06-11 NOTE — Telephone Encounter (Signed)
Received records from [facility] for appointment on  06/12/16 with Dr Tresa Endo.  Records put with Dr Landry Dyke schedule for 06/12/16. lp

## 2016-06-12 ENCOUNTER — Encounter: Payer: Self-pay | Admitting: Cardiovascular Disease

## 2016-06-12 ENCOUNTER — Ambulatory Visit (INDEPENDENT_AMBULATORY_CARE_PROVIDER_SITE_OTHER): Payer: Medicare Other | Admitting: Cardiovascular Disease

## 2016-06-12 VITALS — BP 124/74 | HR 78 | Ht 64.0 in | Wt 191.0 lb

## 2016-06-12 DIAGNOSIS — R011 Cardiac murmur, unspecified: Secondary | ICD-10-CM

## 2016-06-12 DIAGNOSIS — Z79899 Other long term (current) drug therapy: Secondary | ICD-10-CM

## 2016-06-12 DIAGNOSIS — E784 Other hyperlipidemia: Secondary | ICD-10-CM | POA: Diagnosis not present

## 2016-06-12 DIAGNOSIS — I517 Cardiomegaly: Secondary | ICD-10-CM | POA: Diagnosis not present

## 2016-06-12 DIAGNOSIS — E7849 Other hyperlipidemia: Secondary | ICD-10-CM

## 2016-06-12 MED ORDER — ROSUVASTATIN CALCIUM 20 MG PO TABS: 20.0000 mg | ORAL_TABLET | Freq: Every day | ORAL | 3 refills | Status: DC

## 2016-06-12 NOTE — Patient Instructions (Addendum)
Your physician has requested that you have an echocardiogram. Echocardiography is a painless test that uses sound waves to create images of your heart. It provides your doctor with information about the size and shape of your heart and how well your heart's chambers and valves are working. This procedure takes approximately one hour. There are no restrictions for this procedure.  This will be done at the [facility] OFFICE.  Your physician recommends that you return for lab work in: 2 months fasting.   Your physician has recommended you make the following change in your medication: start new prescription given today for rosuvastatin 20 mg. This has been sent to your pharmacy.   Your physician recommends that you schedule a follow-up appointment in: 2 months.

## 2016-06-12 NOTE — Progress Notes (Signed)
Cardiology Office Note    Date:  06/14/2016   ID:  Michelle Castaneda, DOB [birth year], MRN [id number]  PCP:  Daisy Floro, MD  Cardiologist:  Nicki Guadalajara, MD   Chief Complaint  Patient presents with  . New Patient (Initial Visit)    has a heart murmur, pcp recomended ECHO, no chest pain, shortness of breath, edema, pain or cramping in legs, lightheaded or dizziness    History of Present Illness:  Michelle Castaneda is a 66 y.o. female who is referred through the courtesy of Dr. Lynnell Dike for further evaluation of cardiac murmur and cardiology evaluation.  Michelle Castaneda has a history of rheumatoid arthritis, and is status post a parathyroidectomy and thyroid nodule removal.  I take care of her husband.  She has been found to have a cardiac murmur and admits of being told of having this for a long time..  She does not recall having had an echo Doppler study.  She denies any episodes of chest pain, presyncope or syncope, and denies any awareness of change in ability to walk, PND or orthopnea.  She states that she is in an exercise class 4 days per week for 30 minutes and on 2 other days typically walks so that she is exercising at least 6 days per week.  I have reviewed records from [facility] physicians and in the note of Dr. Tenny Craw.  There is mention that the patient has a bicuspid aortic valve.  The patient is unaware of when this diagnosis was made.  Of note, review of laboratory indicates that she has significant hyperlipidemia which is untreated.  In February 2017.  Total cholesterol was 325 with an LDL of 210 and a non-HDL of 220.  HDL was 105.  In May 2018.  Total cholesterol was 300 with an LDL of 186, and HDL remained high at 102 with normal triglycerides at 60.  She presents for cardiology evaluation.    Past Medical History:  Diagnosis Date  . DDD (degenerative disc disease), lumbar   . GERD (gastroesophageal reflux disease)   . Heart murmur   . RA (rheumatoid arthritis) (HCC)      Past Surgical History:  Procedure Laterality Date  . CERVICAL POLYPECTOMY N/A 05/30/2015   Procedure: POLYPECTOMY;  Surgeon: Gerald Leitz, MD;  Location: [facility];  Service: Gynecology;  Laterality: N/A;  . COLONOSCOPY    . DIAGNOSTIC LAPAROSCOPY    . DILATATION & CURETTAGE/HYSTEROSCOPY WITH MYOSURE N/A 05/30/2015   Procedure: DILATATION & CURETTAGE/HYSTEROSCOPY WITH MYOSURE;  Surgeon: Gerald Leitz, MD;  Location: [facility];  Service: Gynecology;  Laterality: N/A;  . LAPAROSCOPY FOR ECTOPIC PREGNANCY    . THYROID SURGERY    . TUBAL LIGATION      Current Medications: Outpatient Medications Prior to Visit  Medication Sig Dispense Refill  . B Complex Vitamins (VITAMIN B COMPLEX) TABS Take 1 tablet by mouth daily.    . hydroxychloroquine (PLAQUENIL) 200 MG tablet Take 200 mg by mouth 2 (two) times daily.    Marland Kitchen ibuprofen (ADVIL,MOTRIN) 800 MG tablet Take 1 tablet (800 mg total) by mouth every 8 (eight) hours as needed for mild pain or moderate pain. 30 tablet 1  . leflunomide (ARAVA) 20 MG tablet Take 20 mg by mouth daily.    Marland Kitchen oxyCODONE-acetaminophen (ROXICET) 5-325 MG tablet Take 1-2 tablets by mouth every 4 (four) hours as needed for severe pain. 20 tablet 0  . Red Yeast Rice 600 MG CAPS Take 1 capsule by  mouth daily.     No facility-administered medications prior to visit.      Allergies:   Aspirin; Iodine; and Naprosyn [naproxen]   Social History   Social History  . Marital status: Married    Spouse name: N/A  . Number of children: N/A  . Years of education: N/A   Social History Main Topics  . Smoking status: Former Games developer  . Smokeless tobacco: Never Used  . Alcohol use Yes     Comment: occ  . Drug use: No  . Sexual activity: Not Asked   Other Topics Concern  . None   Social History Narrative  . None    Additional social history is that she is married for 49 years.  She has 3 sons, 5 grandchildren, and 2 great-grandchildren.  I take care of he patient's husband who recently  had valve surgery  Family History:  The patient's family history includes Alcoholism in her father; Diabetes in her brother, father, and paternal grandmother; Diabetes Mellitus II in her mother; Heart disease in her brother; Lung cancer in her maternal grandfather.   Her mother died at age 23 with heart failure.  She has 4 deceased brothers and 3 brothers are alive.  ROS General: Negative; No fevers, chills, or night sweats;  HEENT: Negative; No changes in vision or hearing, sinus congestion, difficulty swallowing Pulmonary: Negative; No cough, wheezing, shortness of breath, hemoptysis Cardiovascular: See history of present illness GI: Negative; No nausea, vomiting, diarrhea, or abdominal pain GU: Negative; No dysuria, hematuria, or difficulty voiding Musculoskeletal: Negative; no myalgias, joint pain, or weakness Hematologic/Oncology: Negative; no easy bruising, bleeding Endocrine: Negative; no heat/cold intolerance; no diabetes Neuro: Negative; no changes in balance, headaches Skin: Negative; No rashes or skin lesions Psychiatric: Negative; No behavioral problems, depression Sleep: Negative; No snoring, daytime sleepiness, hypersomnolence, bruxism, restless legs, hypnogognic hallucinations, no cataplexy Other comprehensive 14 point system review is negative.   PHYSICAL EXAM:   VS:  BP 124/74 (BP Location: Right Arm, Patient Position: Sitting, Cuff Size: Normal)   Pulse 78   Ht 5\' 4"  (1.626 m)   Wt 191 lb (86.6 kg)   BMI 32.79 kg/m     Repeat blood pressure by me was 140/72, supine and was 138/70 standing.  Wt Readings from Last 3 Encounters:  06/12/16 191 lb (86.6 kg)  05/09/15 198 lb 8 oz (90 kg)    General: Alert, oriented, no distress.  Skin: normal turgor, no rashes, warm and dry HEENT: Normocephalic, atraumatic. Pupils equal round and reactive to light; sclera anicteric; extraocular muscles intact; Fundi disks flat.  No hemorrhages or exudates Nose without nasal septal  hypertrophy Mouth/Parynx benign; Mallinpatti scale 3 Neck: No JVD, no carotid bruits; normal carotid upstroke Lungs: clear to ausculatation and percussion; no wheezing or rales Chest wall: without tenderness to palpitation Heart: PMI not displaced, RRR, s1 s2 normal, 2/6 systolic murmur in the aortic area which is early peaking;  lower sternal border, and apex.  No S3 gallop., no diastolic murmur, no rubs, gallops, thrills, or heaves Abdomen: soft, nontender; no hepatosplenomehaly, BS+; abdominal aorta nontender and not dilated by palpation. Back: no CVA tenderness Pulses 2+ Musculoskeletal: full range of motion, normal strength, no joint deformities Extremities: no clubbing cyanosis or edema, Homan's sign negative  Neurologic: grossly nonfocal; Cranial nerves grossly wnl Psychologic: Normal mood and affect   Studies/Labs Reviewed:   EKG:  EKG is ordered today. ECG (independently read by me): normal sinus rhythm at 78 bpm.  Mild LVH  by voltage criteria in aVL.  Normal intervals.  Recent Labs: [facility] 03/08/2007 03/07/2007 03/07/2007  Glucose - - -  BUN - - -  Creatinine - - -  Sodium - - -  Potassium - - -  Chloride - - -  CO2 - - -  Calcium 9.2 9.5 9.3     No flowsheet data found.  CBC Latest Ref Rng & Units 05/09/2015 03/04/2007  WBC 4.0 - 10.5 K/uL 3.6(L) 4.1  Hemoglobin 12.0 - 15.0 g/dL 67.6 19.5  Hematocrit 09.3 - 46.0 % 41.6 38.1  Platelets 150 - 400 K/uL 171 225   Lab Results  Component Value Date   MCV 88.9 05/09/2015   MCV 88.3 03/04/2007   No results found for: TSH No results found for: HGBA1C   BNP No results found for: BNP  ProBNP No results found for: PROBNP   Lipid Panel  No results found for: CHOL, TRIG, HDL, CHOLHDL, VLDL, LDLCALC, LDLDIRECT   RADIOLOGY: No results found.   Additional studies/ records that were reviewed today include:  I reviewed the patient's records from [place] physicians    ASSESSMENT:    1. Heart murmur, systolic   2.  Other hyperlipidemia   3. LVH (left ventricular hypertrophy)   4. Medication management      PLAN:  Michelle Castaneda is a very pleasant 66 year old female who admits to a long-standing history of a cardiac murmur.  In the [facility] records there is mention of a bicuspid aortic valve, but the patient does not recall having had an echo.  I am recommending that she undergo a 2-D echo Doppler study for further evaluation.  I suspect she does have mild aortic stenosis with possible mitral regurgitation as well.   Presently, she remains asymptomatic and specifically denies any chest pain, presyncope, or any heart failure symptomatology.  Her ECG shows normal sinus rhythm but suggest mild left ventricular hypertrophy.  She exercises regularly.  She has been taking over-the-counter when he sliced hyperlipidemia and has consistently had cholesterols in excess of 300 with LDL cholesterols in excess of 180.  It is my recommendation that she initiate statin therapy in place of red yeast rice  and have written a prescription for Crestor 20 mg daily.  Her systolic murmur is short and not late peaking and argues against severe aortic valvular disease.  I have recommended that she undergo repeat laboratory with a chemistry profile, lipid studies in 2 months after initiating Crestor.  I will see her in follow-up to review her echocardiographic findings with her in detail.   Medication Adjustments/Labs and Tests Ordered: Current medicines are reviewed at length with the patient today.  Concerns regarding medicines are outlined above.  Medication changes, Labs and Tests ordered today are listed in the Patient Instructions below. Patient Instructions  Your physician has requested that you have an echocardiogram. Echocardiography is a painless test that uses sound waves to create images of your heart. It provides your doctor with information about the size and shape of your heart and how well your heart's chambers and valves  are working. This procedure takes approximately one hour. There are no restrictions for this procedure.  This will be done at the [facility] OFFICE.  Your physician recommends that you return for lab work in: 2 months fasting.   Your physician has recommended you make the following change in your medication: start new prescription given today for rosuvastatin 20 mg. This has been sent to your pharmacy.  Your physician recommends that you schedule a follow-up appointment in: 2 months.      Signed, Nicki Guadalajara, MD  06/14/2016 1:14 PM    [facility] [address], [address], [place], [place]  [address] Phone: [phone]

## 2016-06-26 ENCOUNTER — Ambulatory Visit (HOSPITAL_COMMUNITY): Payer: Medicare Other | Attending: Cardiovascular Disease

## 2016-07-03 ENCOUNTER — Emergency Department (HOSPITAL_COMMUNITY)
Admission: EM | Admit: 2016-07-03 | Discharge: 2016-07-04 | Disposition: A | Discharge status: Home / Self Care | Payer: Medicare Other | Attending: Emergency Medicine | Admitting: Emergency Medicine

## 2016-07-03 ENCOUNTER — Encounter (HOSPITAL_COMMUNITY): Payer: Self-pay | Admitting: Emergency Medicine

## 2016-07-03 DIAGNOSIS — R0789 Other chest pain: Secondary | ICD-10-CM | POA: Diagnosis present

## 2016-07-03 DIAGNOSIS — Z87891 Personal history of nicotine dependence: Secondary | ICD-10-CM | POA: Insufficient documentation

## 2016-07-03 DIAGNOSIS — R011 Cardiac murmur, unspecified: Secondary | ICD-10-CM | POA: Diagnosis not present

## 2016-07-03 NOTE — ED Triage Notes (Signed)
Pt reports intermittent central CP X several months. States it "spreads" across chest. Denies SOB, N/V.

## 2016-07-04 ENCOUNTER — Emergency Department (HOSPITAL_COMMUNITY): Payer: Medicare Other

## 2016-07-04 LAB — BASIC METABOLIC PANEL
Anion gap: 6 (ref 5–15)
BUN: 11 mg/dL (ref 6–20)
CHLORIDE: 106 mmol/L (ref 101–111)
CO2: 27 mmol/L (ref 22–32)
CREATININE: 0.84 mg/dL (ref 0.44–1.00)
Calcium: 10 mg/dL (ref 8.9–10.3)
GFR calc Af Amer: >60 mL/min (ref >60)
GLUCOSE: 130 mg/dL — AB (ref 65–99)
Potassium: 3.9 mmol/L (ref 3.5–5.1)
SODIUM: 139 mmol/L (ref 135–145)

## 2016-07-04 LAB — I-STAT TROPONIN, ED: Troponin i, poc: 0 ng/mL (ref 0.00–0.08)

## 2016-07-04 LAB — CBC
HEMATOCRIT: 38.6 % (ref 36.0–46.0)
Hemoglobin: 12.6 g/dL (ref 12.0–15.0)
MCH: 29 pg (ref 26.0–34.0)
MCHC: 32.6 g/dL (ref 30.0–36.0)
MCV: 88.9 fL (ref 78.0–100.0)
PLATELETS: 158 10*3/uL (ref 150–400)
RBC: 4.34 MIL/uL (ref 3.87–5.11)
RDW: 12.9 % (ref 11.5–15.5)
WBC: 4.7 10*3/uL (ref 4.0–10.5)

## 2016-07-04 LAB — TROPONIN I

## 2016-07-04 MED ORDER — OMEPRAZOLE 20 MG PO CPDR: 20.0000 mg | DELAYED_RELEASE_CAPSULE | Freq: Every day | ORAL | 0 refills | Status: AC

## 2016-07-04 MED ORDER — GI COCKTAIL ~~LOC~~
30.0000 mL | Freq: Once | ORAL | Status: AC
  Administered 2016-07-04: 30 mL via ORAL

## 2016-07-04 NOTE — ED Notes (Signed)
Pt ambulated without difficulty

## 2016-07-04 NOTE — Discharge Instructions (Signed)
Your testing is negative for heart attack. She still follow up with her cardiologist for a stress test. Return to the ED if your chest pain lasts longer than a minute or 2, becomes exertional, associated with shortness of breath, nausea, sweating or any other concerns.

## 2016-07-04 NOTE — ED Provider Notes (Signed)
[facility]-EMERGENCY DEPT Provider Note   CSN: [id number] Arrival date & time: 07/03/16  2350    By signing my name below, I, Michelle Castaneda, attest that this documentation has been prepared under the direction and in the presence of Marquie Aderhold, Jeannett Senior, MD . Electronically Signed: Elsie Castaneda, ED Scribe. 07/04/2016. 12:55 AM.  History   Chief Complaint Chief Complaint  Patient presents with  . Chest Pain    HPI Michelle Castaneda is a 66 y.o. female with a history of GERD, who presents to the Emergency Department complaining of burning, intermittent, sternal chest pain onset 1 month ago. She hasn't tried any medications for the relief of her symptoms. She notes that her sternal CP radiates to the bilateral sides of her chest and last for approximately 60 seconds an episode several times daily. Pt reports that she came into the ED tonight due to having two similar CP episodes lasting ~30 seconds with 2-3 second break in-between episodes while at rest. Pt states that her CP occurs while at rest and with ambulation. She also states that she thought her CP was related to GERD due her symptoms worsening after eating food. She notes that she used to take GERD medications, but d/c use. Pt reports that she takes ibuprofen daily to aid with he hx of RA. Pt is scheduled for a echocardiogram in the coming weeks due to her hx of a heart murmur. Pt denies diaphoresis, SOB, abdominal pain, nausea, lightheadedness, and any other symptoms. She also denies hx of MI, cardiac stents, stress test, catheterizations, or PMHx of DM.       The history is provided by the patient. No language interpreter was used.    Past Medical History:  Diagnosis Date  . DDD (degenerative disc disease), lumbar   . GERD (gastroesophageal reflux disease)   . Heart murmur   . RA (rheumatoid arthritis) (HCC)     There are no active problems to display for this patient.   Past Surgical History:  Procedure Laterality Date  . CERVICAL  POLYPECTOMY N/A 05/30/2015   Procedure: POLYPECTOMY;  Surgeon: Gerald Leitz, MD;  Location: [facility]S;  Service: Gynecology;  Laterality: N/A;  . COLONOSCOPY    . DIAGNOSTIC LAPAROSCOPY    . DILATATION & CURETTAGE/HYSTEROSCOPY WITH MYOSURE N/A 05/30/2015   Procedure: DILATATION & CURETTAGE/HYSTEROSCOPY WITH MYOSURE;  Surgeon: Gerald Leitz, MD;  Location: [facility]S;  Service: Gynecology;  Laterality: N/A;  . LAPAROSCOPY FOR ECTOPIC PREGNANCY    . THYROID SURGERY    . TUBAL LIGATION      OB History    No data available       Home Medications    Prior to Admission medications   Medication Sig Start Date End Date Taking? Authorizing Provider  B Complex Vitamins (VITAMIN B COMPLEX) TABS Take 1 tablet by mouth daily.    [provider]  calcium carbonate (OSCAL) 1500 (600 Ca) MG TABS tablet Take 2 tablets by mouth daily with breakfast.    [provider]  cyclobenzaprine (FLEXERIL) 10 MG tablet Take 10 mg by mouth at bedtime.    [provider]  hydroxychloroquine (PLAQUENIL) 200 MG tablet Take 200 mg by mouth 2 (two) times daily.    [provider]  ibuprofen (ADVIL,MOTRIN) 800 MG tablet Take 1 tablet (800 mg total) by mouth every 8 (eight) hours as needed for mild pain or moderate pain. 05/30/15   Gerald Leitz, MD  leflunomide (ARAVA) 20 MG tablet Take 20 mg by mouth daily.  [provider]  oxyCODONE-acetaminophen (ROXICET) 5-325 MG tablet Take 1-2 tablets by mouth every 4 (four) hours as needed for severe pain. 05/30/15   Gerald Leitz, MD  Red Yeast Rice 600 MG CAPS Take 1 capsule by mouth daily.    [provider]  rosuvastatin (CRESTOR) 20 MG tablet Take 1 tablet (20 mg total) by mouth daily. 06/12/16 09/10/16  Lennette Bihari, MD    Family History Family History  Problem Relation Age of Onset  . Diabetes Mellitus II Mother   . Alcoholism Father   . Diabetes Father   . Diabetes Brother   . Heart disease Brother   . Lung cancer Maternal Grandfather    . Diabetes Paternal Grandmother     Social History Social History  Substance Use Topics  . Smoking status: Former Games developer  . Smokeless tobacco: Never Used  . Alcohol use Yes     Comment: occ     Allergies   Aspirin; Iodine; and Naprosyn [naproxen]   Review of Systems Review of Systems All systems reviewed and are negative for acute change except as noted in the HPI.    Physical Exam Updated Vital Signs BP (!) 174/86 (BP Location: Left Arm)   Pulse 69   Temp 98.4 F (36.9 C) (Oral)   Resp 18   Ht 5\' 4"  (1.626 m)   Wt 190 lb (86.2 kg)   SpO2 97%   BMI 32.61 kg/m   Physical Exam  Constitutional: She is oriented to person, place, and time. She appears well-developed and well-nourished. No distress.  HENT:  Head: Normocephalic and atraumatic.  Mouth/Throat: Oropharynx is clear and moist. No oropharyngeal exudate.  Eyes: Conjunctivae and EOM are normal. Pupils are equal, round, and reactive to light.  Neck: Normal range of motion. Neck supple.  No meningismus.  Cardiovascular: Intact distal pulses.   Murmur heard. 2/6 soft systolic murmur.  Pulmonary/Chest: Effort normal and breath sounds normal. No respiratory distress.  Abdominal: Soft. There is no tenderness. There is no rebound and no guarding.  Musculoskeletal: Normal range of motion. She exhibits no edema or tenderness.  Neurological: She is alert and oriented to person, place, and time. No cranial nerve deficit. She exhibits normal muscle tone. Coordination normal.   5/5 strength throughout. CN 2-12 intact. Equal grip strength.   Skin: Skin is warm.  Psychiatric: She has a normal mood and affect. Her behavior is normal.  Nursing note and vitals reviewed.    ED Treatments / Results  DIAGNOSTIC STUDIES:  Oxygen Saturation is 99% on RA, normal by my interpretation.    COORDINATION OF CARE:  1:02 AM Discussed treatment plan with pt at bedside and pt agreed to plan.  Labs (all labs ordered are listed,  but only abnormal results are displayed) Labs Reviewed  BASIC METABOLIC PANEL - Abnormal; Notable for the following:       Result Value   Glucose, Bld 130 (*)    All other components within normal limits  CBC  TROPONIN I  I-STAT TROPOININ, ED    EKG  EKG Interpretation  Date/Time:  Friday July 03 2016 23:55:08 EDT Ventricular Rate:  66 PR Interval:  126 QRS Duration: 96 QT Interval:  440 QTC Calculation: 461 R Axis:   -17 Text Interpretation:  Normal sinus rhythm Moderate voltage criteria for LVH, may be normal variant Borderline ECG No significant change was found Confirmed by [id number]) on 07/04/2016 12:06:28 AM       Radiology Dg Chest  2 View  Result Date: 07/04/2016 CLINICAL DATA:  Chest pain. Intermittent pain for months, progressive. EXAM: CHEST  2 VIEW COMPARISON:  01/13/2012 FINDINGS: The cardiomediastinal contours are normal. Probable scarring in the lingula. The previous ill-defined opacities have resolved. Pulmonary vasculature is normal. No consolidation, pleural effusion, or pneumothorax. No acute osseous abnormalities are seen. IMPRESSION: Lingular scarring.  No acute abnormality. Electronically Signed   By: Rubye Oaks M.D.   On: 07/04/2016 00:27    Procedures Procedures (including critical care time)  Medications Ordered in ED Medications  gi cocktail (Maalox,Lidocaine,Donnatal) (30 mLs Oral Given 07/04/16 0108)     Initial Impression / Assessment and Plan / ED Course  I have reviewed the triage vital signs and the nursing notes.  Pertinent labs & imaging results that were available during my care of the patient were reviewed by me and considered in my medical decision making (see chart for details).    Patient with intermittent central burning chest pain for several months that comes and goes. It lasts less than 1 minute at a time. She had 2 episodes today which concerned her. It is not exertional or pleuritic.  EKG is unchanged without  acute ischemic change. Troponin is negative.  Patient's description of pain is atypical for ACS. Heart score 3.   Second troponin negative. Patient with no episodes of chest pain throughout ED stay. She feels well. Her blood pressure has improved without treatment. She normally does not take blood pressure medications.  Patient understands she needs to follow-up for a stress test with her cardiologist. Return to the ED with worsening symptoms including chest pain that lasts for more than a few minutes, is exertional or pleuritic or any other concerns. Start PPI in setting of NSAID use for RA.   Final Clinical Impressions(s) / ED Diagnoses   Final diagnoses:  Atypical chest pain    New Prescriptions New Prescriptions   No medications on file   I personally performed the services described in this documentation, which was scribed in my presence. The recorded information has been reviewed and is accurate.     Glynn Octave, MD 07/04/16 [phone]

## 2016-07-14 ENCOUNTER — Other Ambulatory Visit: Payer: Self-pay

## 2016-07-14 ENCOUNTER — Ambulatory Visit (HOSPITAL_COMMUNITY): Payer: Medicare Other | Attending: Cardiovascular Disease

## 2016-07-14 DIAGNOSIS — I5030 Unspecified diastolic (congestive) heart failure: Secondary | ICD-10-CM | POA: Insufficient documentation

## 2016-07-14 DIAGNOSIS — I061 Rheumatic aortic insufficiency: Secondary | ICD-10-CM | POA: Diagnosis not present

## 2016-07-14 DIAGNOSIS — R011 Cardiac murmur, unspecified: Secondary | ICD-10-CM | POA: Diagnosis not present

## 2016-08-31 ENCOUNTER — Ambulatory Visit: Payer: Medicare Other | Admitting: Cardiovascular Disease

## 2016-09-13 NOTE — Progress Notes (Signed)
Cardiology Office Note    Date:  09/14/2016   ID:  Michelle, Castaneda 24-Mar-1950, MRN [id number]  PCP:  Daisy Floro, MD  Cardiologist: Dr. Tresa Endo   Chief Complaint  Patient presents with  . Follow-up    no chest pain, shortness of breath, edema, pain or cramping in legs, lightheadedness or dizziness    History of Present Illness:    Michelle Castaneda is a 66 y.o. female with past medical history of GERD and RA who presents to the office today for 62-month follow-up.   She was evaluated by Dr. Tresa Endo in 05/2016 as a new patient referral for evaluation of a murmur. Prior notes had mentioned a bicuspid aortic valve, therefore a repeat echo was recommended. This was performed on 07/14/2016 and showed a preserved EF of 60-65% with Grade 1 DD and mild AI. Her recent Lipid Panel had showed a total cholesterol of 300 and LDL of 186, therefore was was also started on Crestor 20mg  daily.   In talking with the patient today, she reports doing well since her last office visit. She performs water aerobics multiple times per week along with walking daily. She denies any chest discomfort or exertional dyspnea with these activities. No recent palpitations, orthopnea, PND, or lower extremity edema. She reports good compliance with her Crestor and denies missing any doses. No noted side effects to the medication.  Past Medical History:  Diagnosis Date  . DDD (degenerative disc disease), lumbar   . GERD (gastroesophageal reflux disease)   . Heart murmur    a. 06/2016: echo showing EF of 60-65% with Grade 1 DD and mild AI.   Marland Kitchen Hyperlipidemia   . RA (rheumatoid arthritis) (HCC)     Past Surgical History:  Procedure Laterality Date  . CERVICAL POLYPECTOMY N/A 05/30/2015   Procedure: POLYPECTOMY;  Surgeon: Gerald Leitz, MD;  Location: [facility];  Service: Gynecology;  Laterality: N/A;  . COLONOSCOPY    . DIAGNOSTIC LAPAROSCOPY    . DILATATION & CURETTAGE/HYSTEROSCOPY WITH MYOSURE N/A 05/30/2015   Procedure: DILATATION & CURETTAGE/HYSTEROSCOPY WITH MYOSURE;  Surgeon: Gerald Leitz, MD;  Location: [facility];  Service: Gynecology;  Laterality: N/A;  . LAPAROSCOPY FOR ECTOPIC PREGNANCY    . THYROID SURGERY    . TUBAL LIGATION      Current Medications: Outpatient Medications Prior to Visit  Medication Sig Dispense Refill  . B Complex Vitamins (VITAMIN B COMPLEX) TABS Take 1 tablet by mouth daily.    . calcium carbonate (OSCAL) 1500 (600 Ca) MG TABS tablet Take 2 tablets by mouth daily with breakfast.    . cyclobenzaprine (FLEXERIL) 10 MG tablet Take 10 mg by mouth at bedtime as needed.     . hydroxychloroquine (PLAQUENIL) 200 MG tablet Take 200 mg by mouth 2 (two) times daily.    Marland Kitchen ibuprofen (ADVIL,MOTRIN) 800 MG tablet Take 1 tablet (800 mg total) by mouth every 8 (eight) hours as needed for mild pain or moderate pain. 30 tablet 1  . leflunomide (ARAVA) 20 MG tablet Take 20 mg by mouth daily.    Marland Kitchen omeprazole (PRILOSEC) 20 MG capsule Take 1 capsule (20 mg total) by mouth daily. 30 capsule 0  . rosuvastatin (CRESTOR) 20 MG tablet Take 1 tablet (20 mg total) by mouth daily. 90 tablet 3  . oxyCODONE-acetaminophen (ROXICET) 5-325 MG tablet Take 1-2 tablets by mouth every 4 (four) hours as needed for severe pain. (Patient not taking: Reported on 09/14/2016) 20 tablet 0  . Red  Yeast Rice 600 MG CAPS Take 1 capsule by mouth daily.     No facility-administered medications prior to visit.      Allergies:   Aspirin; Iodine; and Naprosyn [naproxen]   Social History   Social History  . Marital status: Married    Spouse name: N/A  . Number of children: N/A  . Years of education: N/A   Social History Main Topics  . Smoking status: Former Games developer  . Smokeless tobacco: Never Used  . Alcohol use Yes     Comment: occ  . Drug use: No  . Sexual activity: Not Asked   Other Topics Concern  . None   Social History Narrative  . None     Family History:  The patient's family history includes  Alcoholism in her father; Diabetes in her brother, father, and paternal grandmother; Diabetes Mellitus II in her mother; Heart disease in her brother; Lung cancer in her maternal grandfather.   Review of Systems:   Please see the history of present illness.     General:  No chills, fever, night sweats or weight changes.  Cardiovascular:  No chest pain, dyspnea on exertion, edema, orthopnea, palpitations, paroxysmal nocturnal dyspnea. Dermatological: No rash, lesions/masses Respiratory: No cough, dyspnea Urologic: No hematuria, dysuria MSK: Positive for back pain.  Abdominal:   No nausea, vomiting, diarrhea, bright red blood per rectum, melena, or hematemesis Neurologic:  No visual changes, wkns, changes in mental status.   All other systems reviewed and are otherwise negative except as noted above.   Physical Exam:    VS:  BP 134/84   Pulse 80   Ht 5\' 6"  (1.676 m)   Wt 191 lb (86.6 kg)   BMI 30.83 kg/m    General: Well developed, well nourished American female appearing in no acute distress. Head: Normocephalic, atraumatic, sclera non-icteric, no xanthomas, nares are without discharge.  Neck: No carotid bruits. JVD not elevated.  Lungs: Respirations regular and unlabored, without wheezes or rales.  Heart: Regular rate and rhythm. No S3 or S4.  No rubs or gallops appreciated. 2/6 SEM along RUSB. Abdomen: Soft, non-tender, non-distended with normoactive bowel sounds. No hepatomegaly. No rebound/guarding. No obvious abdominal masses. Msk:  Strength and tone appear normal for age. No joint deformities or effusions. Extremities: No clubbing or cyanosis. No lower extremity edema.  Distal pedal pulses are 2+ bilaterally. Neuro: Alert and oriented X 3. Moves all extremities spontaneously. No focal deficits noted. Psych:  Responds to questions appropriately with a normal affect. Skin: No rashes or lesions noted  Wt Readings from Last 3 Encounters:  09/14/16 191 lb (86.6 kg)    07/03/16 190 lb (86.2 kg)  06/12/16 191 lb (86.6 kg)     Studies/Labs Reviewed:   EKG:  EKG is not ordered today.    Recent Labs: 07/03/2016: BUN 11; Creatinine, Ser 0.84; Hemoglobin 12.6; Platelets 158; Potassium 3.9; Sodium 139   Lipid Panel No results found for: CHOL, TRIG, HDL, CHOLHDL, VLDL, LDLCALC, LDLDIRECT  Additional studies/ records that were reviewed today include:   Echocardiogram: 07/14/2016 Study Conclusions  - Left ventricle: The cavity size was normal. Wall thickness was   normal. Systolic function was normal. The estimated ejection   fraction was in the range of 60% to 65%. Wall motion was normal;   there were no regional wall motion abnormalities. Doppler   parameters are consistent with abnormal left ventricular   relaxation (grade 1 diastolic dysfunction). - Aortic valve: There was mild regurgitation.  Assessment:    1. Nonrheumatic aortic valve insufficiency   2. Heart murmur, systolic   3. Hyperlipidemia LDL goal <130   4. Medication management   5. Gastroesophageal reflux disease without esophagitis      Plan:   In order of problems listed above:  1. Aortic Insufficiency/ Murmur - the patient has a 15+ year history of a heart murmur. An echo was ordered at the time of her last visit and showed a preserved EF of 60-65% with Grade 1 DD and mild AI. - she denies any recent chest pain, dyspnea, lightheadedness, or presyncope and is very active at baseline. - reviewed echo results with the patient in detail. Will follow her AI at a distance with plans for a repeat echo in several years unless she develops acute symptoms.   2. HLD - recent Lipid Panel in 05/2016 showed total cholesterol of 300 with LDL at 186. Started on Crestor 20mg  daily at that time.  - will recheck FLP and LFT's today.   3. GERD - remains on Omeprazole 20mg  daily.   Medication Adjustments/Labs and Tests Ordered: Current medicines are reviewed at length with the patient  today.  Concerns regarding medicines are outlined above.  Medication changes, Labs and Tests ordered today are listed in the Patient Instructions below. Patient Instructions  Medication Instructions:  Your physician recommends that you continue on your current medications as directed. Please refer to the Current Medication list given to you today.  If you need a refill on your cardiac medications before your next appointment, please call your pharmacy.  Labwork: FLP AND CMP TODAY HERE IN OUR OFFICE AT [facility]  Follow-Up: Your physician wants you to follow-up in: 12 MONTHS WITH DR You should receive a reminder letter in the mail two months in advance. If you do not receive a letter, please call our office June 2019 to schedule the AUGUST 2019 follow-up appointment.  Thank you for choosing [facility] at July 2019, September 2019, [employer]  09/14/2016 10:27 AM    [facility] [address] [place], [address] [place], [place]  [place] Phone: [phone]; Fax: [phone]  [address] [place], [address] [place] Phone: [phone]

## 2016-09-14 ENCOUNTER — Ambulatory Visit (INDEPENDENT_AMBULATORY_CARE_PROVIDER_SITE_OTHER): Payer: Medicare Other | Admitting: Student

## 2016-09-14 ENCOUNTER — Encounter: Payer: Self-pay | Admitting: Student

## 2016-09-14 VITALS — BP 134/84 | HR 80 | Ht 66.0 in | Wt 191.0 lb

## 2016-09-14 DIAGNOSIS — R011 Cardiac murmur, unspecified: Secondary | ICD-10-CM | POA: Diagnosis not present

## 2016-09-14 DIAGNOSIS — E785 Hyperlipidemia, unspecified: Secondary | ICD-10-CM | POA: Diagnosis not present

## 2016-09-14 DIAGNOSIS — I351 Nonrheumatic aortic (valve) insufficiency: Secondary | ICD-10-CM | POA: Insufficient documentation

## 2016-09-14 DIAGNOSIS — Z79899 Other long term (current) drug therapy: Secondary | ICD-10-CM | POA: Diagnosis not present

## 2016-09-14 DIAGNOSIS — K219 Gastro-esophageal reflux disease without esophagitis: Secondary | ICD-10-CM

## 2016-09-14 LAB — COMPREHENSIVE METABOLIC PANEL
ALT: 30 IU/L (ref 0–32)
AST: 28 IU/L (ref 0–40)
Albumin/Globulin Ratio: 1.9 (ref 1.2–2.2)
Albumin: 4.4 g/dL (ref 3.6–4.8)
Alkaline Phosphatase: 105 IU/L (ref 39–117)
BUN/Creatinine Ratio: 27 (ref 12–28)
BUN: 20 mg/dL (ref 8–27)
Bilirubin Total: 0.3 mg/dL (ref 0.0–1.2)
CALCIUM: 10.4 mg/dL — AB (ref 8.7–10.3)
CO2: 24 mmol/L (ref 20–29)
CREATININE: 0.74 mg/dL (ref 0.57–1.00)
Chloride: 106 mmol/L (ref 96–106)
GFR calc Af Amer: 97 mL/min/{1.73_m2} (ref >59)
GFR, EST NON AFRICAN AMERICAN: 84 mL/min/{1.73_m2} (ref >59)
GLOBULIN, TOTAL: 2.3 g/dL (ref 1.5–4.5)
Glucose: 118 mg/dL — ABNORMAL HIGH (ref 65–99)
Potassium: 5.2 mmol/L (ref 3.5–5.2)
SODIUM: 142 mmol/L (ref 134–144)
TOTAL PROTEIN: 6.7 g/dL (ref 6.0–8.5)

## 2016-09-14 LAB — LIPID PANEL
CHOLESTEROL TOTAL: 188 mg/dL (ref 100–199)
Chol/HDL Ratio: 1.9 ratio (ref 0.0–4.4)
HDL: 100 mg/dL (ref >39)
LDL CALC: 77 mg/dL (ref 0–99)
Triglycerides: 55 mg/dL (ref 0–149)
VLDL Cholesterol Cal: 11 mg/dL (ref 5–40)

## 2016-09-14 NOTE — Patient Instructions (Signed)
Medication Instructions:  Your physician recommends that you continue on your current medications as directed. Please refer to the Current Medication list given to you today.  If you need a refill on your cardiac medications before your next appointment, please call your pharmacy.  Labwork: FLP AND CMP TODAY HERE IN OUR OFFICE AT [facility]  Follow-Up: Your physician wants you to follow-up in: 12 MONTHS WITH DR Tresa Endo You should receive a reminder letter in the mail two months in advance. If you do not receive a letter, please call our office June 2019 to schedule the AUGUST 2019 follow-up appointment.  Thank you for choosing [facility] at [facility]!!

## 2016-09-15 ENCOUNTER — Encounter: Payer: Self-pay | Admitting: *Deleted

## 2017-06-23 ENCOUNTER — Other Ambulatory Visit: Payer: Self-pay | Admitting: Family Medicine

## 2017-06-23 DIAGNOSIS — Z1231 Encounter for screening mammogram for malignant neoplasm of breast: Secondary | ICD-10-CM

## 2017-07-05 ENCOUNTER — Ambulatory Visit: Payer: Medicare Other

## 2017-07-21 ENCOUNTER — Ambulatory Visit
Admission: RE | Admit: 2017-07-21 | Discharge: 2017-07-21 | Disposition: A | Discharge status: Home / Self Care | Payer: Medicare Other | Source: Ambulatory Visit | Attending: Family Medicine | Admitting: Family Medicine

## 2017-07-21 DIAGNOSIS — Z1231 Encounter for screening mammogram for malignant neoplasm of breast: Secondary | ICD-10-CM

## 2017-11-25 ENCOUNTER — Telehealth: Payer: Self-pay | Admitting: Cardiovascular Disease

## 2017-11-25 MED ORDER — ROSUVASTATIN CALCIUM 20 MG PO TABS: 20.0000 mg | ORAL_TABLET | Freq: Every day | ORAL | 0 refills | Status: DC

## 2017-11-25 NOTE — Telephone Encounter (Signed)
Rx request sent to pharmacy. Patient needs appt for more refills. Last ov in 08/2016 with Randall An, PA.

## 2017-11-25 NOTE — Telephone Encounter (Signed)
New message:       *STAT* If patient is at the pharmacy, call can be transferred to refill team.   1. Which medications need to be refilled? (please list name of each medication and dose if known) rosuvastatin (CRESTOR) 20 MG tablet(Expired)  2. Which pharmacy/location (including street and city if local pharmacy) is medication to be sent to?[id number] [place], [place] - [address]  3. Do they need a 30 day or 90 day supply? 30

## 2017-11-26 ENCOUNTER — Other Ambulatory Visit: Payer: Self-pay

## 2017-12-20 ENCOUNTER — Other Ambulatory Visit: Payer: Self-pay | Admitting: Cardiovascular Disease

## 2018-03-02 ENCOUNTER — Other Ambulatory Visit: Payer: Self-pay | Admitting: Cardiovascular Disease

## 2018-03-29 ENCOUNTER — Other Ambulatory Visit: Payer: Self-pay | Admitting: Cardiovascular Disease

## 2018-06-15 ENCOUNTER — Other Ambulatory Visit: Payer: Self-pay

## 2018-06-15 ENCOUNTER — Ambulatory Visit: Payer: Medicare Other | Admitting: Orthopaedic Surgery

## 2018-06-15 ENCOUNTER — Encounter: Payer: Self-pay | Admitting: Orthopaedic Surgery

## 2018-06-15 DIAGNOSIS — M5432 Sciatica, left side: Secondary | ICD-10-CM

## 2018-06-15 NOTE — Addendum Note (Signed)
Addended by: Henrine Screws L on: 06/15/2018 01:29 PM   Modules accepted: Orders

## 2018-06-15 NOTE — Progress Notes (Signed)
Office Visit Note   Patient: Michelle Castaneda           Date of Birth: 09/12/1950           MRN: [id number] Visit Date: 06/15/2018              Requested by: Daisy Floro, MD [address] [place], [place] [address] PCP: Daisy Floro, MD   Assessment & Plan: Visit Diagnoses:  1. Sciatica, left side     Plan: Given the failure of rest, activity modification, time, physical therapy and multiple epidural steroid injections in the lumbar spine, at this point she would like a referral for a surgical consultation to determine whether or not surgery on the lumbar spine could help decrease her pain and improve her quality of life.  Again she is someone who is not been taking much pain medications at all and avoids these.  She does most of the more comfortable than what she has been and given the failure of conservative treatment I agree with the need for having a spine specialist/surgeon take a look at her lumbar spine.  We will work on making a referral to 1 of my colleagues.  Follow-Up Instructions: Return if symptoms worsen or fail to improve.   Orders:  No orders of the defined types were placed in this encounter.  No orders of the defined types were placed in this encounter.     Procedures: No procedures performed   Clinical Data: No additional findings.   Subjective: Chief Complaint  Patient presents with   Lower Back - Pain   Left Leg - Pain  The patient is a very pleasant 68 year old female that I am actually seeing for the first time as a relates to her lumbar spine.  However she has had extensive work-up of her lumbar spine.  She saw an orthopedic spine surgeon 4 years ago who at the time felt that she would benefit more from therapy and injections.  She is actually been going through that for 4 years now and most recently had epidural steroid injections by Dr. Regino Schultze about 2 to 3 months ago.  She is gotten to the point where the epidural injections do not  work anymore.  She has been to physical therapy as well.  She does take an occasional tramadol.  She has rheumatoid disease as well.  Her pain is gotten worse in terms of how it radiates from her low back down her left leg to her left foot.  She does report weakness in her legs.  She reports numbness and tingling down her left leg into her left foot.  She denies any change in bowel or bladder function.  She hurts even to the touch in her lumbar spine.  She is not someone who likes to take anything more than tramadol and does not abuse this either.  She has been on Plaquenil as well as of her other medications listed in her chart.  She also understands that I do not perform any spine surgery but she wanted know if there is someone I can send her to at this point.  She feels like it is 5 she is not as active as she could be and she feels like she is too young to be dealing with this type of pain for the rest of her life.  HPI  Review of Systems She currently denies any headache, chest pain, shortness of breath, fever, chills, nausea, vomiting  Objective: Vital  Signs: There were no vitals taken for this visit.  Physical Exam She is alert and orient x3 and in no acute distress Ortho Exam Examination of her bilateral lower extremity shows excellent strength in both legs throughout all muscle groups.  She does have subjective decrease sensation on the lateral aspect of her left leg comparing her left and right legs.  Her reflexes are equal.  She has a significantly positive straight leg raise in terms of pain with the left leg and not with the right. Specialty Comments:  No specialty comments available.  Imaging: No results found. She does have an MRI that accompanies her as well as an MRI report that was done in June 2019.  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  This is causing moderate foraminal  stenosis at several levels.  There is a slight anterolisthesis as well.  PMFS History: Patient Active Problem List   Diagnosis Date Noted   Hyperlipidemia LDL goal <130 09/14/2016   Aortic insufficiency 09/14/2016   Past Medical History:  Diagnosis Date   DDD (degenerative disc disease), lumbar    GERD (gastroesophageal reflux disease)    Heart murmur    a. 06/2016: echo showing EF of 60-65% with Grade 1 DD and mild AI.    Hyperlipidemia    RA (rheumatoid arthritis) (HCC)     Family History  Problem Relation Age of Onset   Diabetes Mellitus II Mother    Alcoholism Father    Diabetes Father    Diabetes Brother    Heart disease Brother    Lung cancer Maternal Grandfather    Diabetes Paternal Grandmother     Past Surgical History:  Procedure Laterality Date   CERVICAL POLYPECTOMY N/A 05/30/2015   Procedure: POLYPECTOMY;  Surgeon: Gerald Leitzara Cole, MD;  Location: [facility];  Service: Gynecology;  Laterality: N/A;   COLONOSCOPY     DIAGNOSTIC LAPAROSCOPY     DILATATION & CURETTAGE/HYSTEROSCOPY WITH MYOSURE N/A 05/30/2015   Procedure: DILATATION & CURETTAGE/HYSTEROSCOPY WITH MYOSURE;  Surgeon: Gerald Leitzara Cole, MD;  Location: [facility];  Service: Gynecology;  Laterality: N/A;   LAPAROSCOPY FOR ECTOPIC PREGNANCY     THYROID SURGERY     TUBAL LIGATION     Social History   Occupational History   Not on file  Tobacco Use   Smoking status: Former Smoker   Smokeless tobacco: Never Used  Substance and Sexual Activity   Alcohol use: Yes    Comment: occ   Drug use: No   Sexual activity: Not on file

## 2018-06-25 ENCOUNTER — Telehealth: Payer: Self-pay | Admitting: Adult Health

## 2018-06-25 ENCOUNTER — Telehealth: Payer: Self-pay | Admitting: *Deleted

## 2018-06-25 ENCOUNTER — Other Ambulatory Visit: Payer: Medicare Other

## 2018-06-25 DIAGNOSIS — Z20822 Contact with and (suspected) exposure to covid-19: Secondary | ICD-10-CM

## 2018-06-25 NOTE — Addendum Note (Signed)
Addended by: Amado Coe on: 06/25/2018 02:42 PM   Modules accepted: Orders

## 2018-06-25 NOTE — Telephone Encounter (Signed)
Called pt at home number and a female answered the phone stating the pt was not there at this time. Left message to have pt to return call. Pt will need to be offered testing for COVID-19 due to potential exposure during recent visit.

## 2018-06-25 NOTE — Telephone Encounter (Signed)
Pt returned call and pt notified of potential exposure to COVID-19 during recent visit. Pt verbalized understanding and scheduled for testing on 06/25/18 at 4:15 pm at the [facility] site. Pt advised to wear a mask at appt and to remain in car. Pt verbalized understanding.

## 2018-06-27 LAB — NOVEL CORONAVIRUS, NAA: SARS-CoV-2, NAA: NOT DETECTED

## 2018-07-14 ENCOUNTER — Other Ambulatory Visit: Payer: Self-pay | Admitting: Neurosurgery

## 2018-07-20 NOTE — Pre-Procedure Instructions (Signed)
[id number] [place], [place] - [address] [place] [address] Phone: [phone] Fax: [phone]      Your procedure is scheduled on Monday 07-25-18  Report to [facility] Main Entrance "A" at 1015 A.M., and check in at the Admitting office.  Call this number if you have problems the morning of surgery:  [phone]  Call [phone] if you have any questions prior to your surgery date Monday-Friday 8am-4pm    Remember:  Do not eat or drink after midnight.  Take these medicines the morning of surgery with A SIP OF WATER : amLODipine (NORVASC)  rosuvastatin (CRESTOR) omeprazole (PRILOSEC)as needed  7 days prior to surgery STOP taking any Aspirin (unless otherwise instructed by your surgeon), Aleve, Naproxen, Ibuprofen, Motrin, Advil, Goody's, BC's, all herbal medications, fish oil, and all vitamins.    The Morning of Surgery  Do not wear jewelry, make-up or nail polish.  Do not wear lotions, powders, or perfumes/colognes, or deodorant  Do not shave 48 hours prior to surgery.   Do not bring valuables to the hospital.  [facility] is not responsible for any belongings or valuables.  If you are a smoker, DO NOT Smoke 24 hours prior to surgery IF you wear a CPAP at night please bring your mask, tubing, and machine the morning of surgery   Remember that you must have someone to transport you home after your surgery, and remain with you for 24 hours if you are discharged the same day.   Contacts, glasses, hearing aids, dentures or bridgework may not be worn into surgery.    Leave your suitcase in the car.  After surgery it may be brought to your room.  For patients admitted to the hospital, discharge time will be determined by your treatment team.  Patients discharged the day of surgery will not be allowed to drive home.    Special instructions:   [facility]- Preparing For Surgery  Before surgery, you can play an important role.  Because skin is not sterile, your skin needs to be as free of germs as possible. You can reduce the number of germs on your skin by washing with CHG (chlorahexidine gluconate) Soap before surgery.  CHG is an antiseptic cleaner which kills germs and bonds with the skin to continue killing germs even after washing.    Oral Hygiene is also important to reduce your risk of infection.  Remember - BRUSH YOUR TEETH THE MORNING OF SURGERY WITH YOUR REGULAR TOOTHPASTE  Please do not use if you have an allergy to CHG or antibacterial soaps. If your skin becomes reddened/irritated stop using the CHG.  Do not shave (including legs and underarms) for at least 48 hours prior to first CHG shower. It is OK to shave your face.  Please follow these instructions carefully.   1. Shower the NIGHT BEFORE SURGERY and the MORNING OF SURGERY with CHG Soap.   2. If you chose to wash your hair, wash your hair first as usual with your normal shampoo.  3. After you shampoo, rinse your hair and body thoroughly to remove the shampoo.  4. Use CHG as you would any other liquid soap. You can apply CHG directly to the skin and wash gently with a scrungie or a clean washcloth.   5. Apply the CHG Soap to your body ONLY FROM THE NECK DOWN.  Do not use on open wounds or open sores. Avoid contact with your eyes, ears, mouth and genitals (private  parts). Wash Face and genitals (private parts)  with your normal soap.   6. Wash thoroughly, paying special attention to the area where your surgery will be performed.  7. Thoroughly rinse your body with warm water from the neck down.  8. DO NOT shower/wash with your normal soap after using and rinsing off the CHG Soap.  9. Pat yourself dry with a CLEAN TOWEL.  10. Wear CLEAN PAJAMAS to bed the night before surgery, wear comfortable clothes the morning of surgery  11. Place CLEAN SHEETS on your bed the night of your first shower and DO NOT SLEEP WITH PETS.  Day of Surgery:  Do not  apply any deodorants/lotions.  Please wear clean clothes to the hospital/surgery center.   Remember to brush your teeth WITH YOUR REGULAR TOOTHPASTE.   Please read over the following fact sheets that you were given.

## 2018-07-21 ENCOUNTER — Other Ambulatory Visit: Payer: Self-pay

## 2018-07-21 ENCOUNTER — Encounter (HOSPITAL_COMMUNITY): Payer: Self-pay

## 2018-07-21 ENCOUNTER — Encounter (HOSPITAL_COMMUNITY)
Admission: RE | Admit: 2018-07-21 | Discharge: 2018-07-21 | Disposition: A | Discharge status: Home / Self Care | Payer: Medicare Other | Source: Ambulatory Visit | Attending: Neurosurgery | Admitting: Neurosurgery

## 2018-07-21 ENCOUNTER — Other Ambulatory Visit (HOSPITAL_COMMUNITY)
Admission: RE | Admit: 2018-07-21 | Discharge: 2018-07-21 | Disposition: A | Discharge status: Home / Self Care | Payer: Medicare Other | Source: Ambulatory Visit | Attending: Neurosurgery | Admitting: Neurosurgery

## 2018-07-21 DIAGNOSIS — I10 Essential (primary) hypertension: Secondary | ICD-10-CM | POA: Diagnosis not present

## 2018-07-21 DIAGNOSIS — Z1159 Encounter for screening for other viral diseases: Secondary | ICD-10-CM | POA: Insufficient documentation

## 2018-07-21 DIAGNOSIS — E785 Hyperlipidemia, unspecified: Secondary | ICD-10-CM | POA: Insufficient documentation

## 2018-07-21 DIAGNOSIS — M4316 Spondylolisthesis, lumbar region: Secondary | ICD-10-CM | POA: Insufficient documentation

## 2018-07-21 DIAGNOSIS — Z87891 Personal history of nicotine dependence: Secondary | ICD-10-CM | POA: Insufficient documentation

## 2018-07-21 DIAGNOSIS — M069 Rheumatoid arthritis, unspecified: Secondary | ICD-10-CM | POA: Diagnosis not present

## 2018-07-21 DIAGNOSIS — K219 Gastro-esophageal reflux disease without esophagitis: Secondary | ICD-10-CM | POA: Diagnosis not present

## 2018-07-21 DIAGNOSIS — Z01818 Encounter for other preprocedural examination: Secondary | ICD-10-CM | POA: Diagnosis present

## 2018-07-21 DIAGNOSIS — Z791 Long term (current) use of non-steroidal anti-inflammatories (NSAID): Secondary | ICD-10-CM | POA: Insufficient documentation

## 2018-07-21 DIAGNOSIS — I491 Atrial premature depolarization: Secondary | ICD-10-CM | POA: Insufficient documentation

## 2018-07-21 DIAGNOSIS — Z79899 Other long term (current) drug therapy: Secondary | ICD-10-CM | POA: Diagnosis not present

## 2018-07-21 HISTORY — DX: Essential (primary) hypertension: I10

## 2018-07-21 HISTORY — DX: Pneumonia, unspecified organism: J18.9

## 2018-07-21 HISTORY — DX: Presence of dental prosthetic device (complete) (partial): Z97.2

## 2018-07-21 HISTORY — DX: Spondylolisthesis, lumbar region: M43.16

## 2018-07-21 HISTORY — DX: Presence of spectacles and contact lenses: Z97.3

## 2018-07-21 LAB — CBC WITH DIFFERENTIAL/PLATELET
Abs Immature Granulocytes: 0 10*3/uL (ref 0.00–0.07)
Basophils Absolute: 0 10*3/uL (ref 0.0–0.1)
Basophils Relative: 1 %
Eosinophils Absolute: 0.1 10*3/uL (ref 0.0–0.5)
Eosinophils Relative: 2 %
HCT: 42.5 % (ref 36.0–46.0)
Hemoglobin: 13.2 g/dL (ref 12.0–15.0)
Immature Granulocytes: 0 %
Lymphocytes Relative: 42 %
Lymphs Abs: 1.4 10*3/uL (ref 0.7–4.0)
MCH: 29 pg (ref 26.0–34.0)
MCHC: 31.1 g/dL (ref 30.0–36.0)
MCV: 93.4 fL (ref 80.0–100.0)
Monocytes Absolute: 0.3 10*3/uL (ref 0.1–1.0)
Monocytes Relative: 9 %
Neutro Abs: 1.5 10*3/uL — ABNORMAL LOW (ref 1.7–7.7)
Neutrophils Relative %: 46 %
Platelets: 140 10*3/uL — ABNORMAL LOW (ref 150–400)
RBC: 4.55 MIL/uL (ref 3.87–5.11)
RDW: 12.3 % (ref 11.5–15.5)
WBC: 3.2 10*3/uL — ABNORMAL LOW (ref 4.0–10.5)
nRBC: 0 % (ref 0.0–0.2)

## 2018-07-21 LAB — BASIC METABOLIC PANEL
Anion gap: 6 (ref 5–15)
BUN: 11 mg/dL (ref 8–23)
CO2: 26 mmol/L (ref 22–32)
Calcium: 9.9 mg/dL (ref 8.9–10.3)
Chloride: 108 mmol/L (ref 98–111)
Creatinine, Ser: 0.6 mg/dL (ref 0.44–1.00)
GFR calc Af Amer: >60 mL/min (ref >60)
GFR calc non Af Amer: >60 mL/min (ref >60)
Glucose, Bld: 105 mg/dL — ABNORMAL HIGH (ref 70–99)
Potassium: 4 mmol/L (ref 3.5–5.1)
Sodium: 140 mmol/L (ref 135–145)

## 2018-07-21 LAB — SURGICAL PCR SCREEN
MRSA, PCR: NEGATIVE
Staphylococcus aureus: NEGATIVE

## 2018-07-21 LAB — TYPE AND SCREEN
ABO/RH(D): B POS
Antibody Screen: NEGATIVE

## 2018-07-21 LAB — ABO/RH: ABO/RH(D): B POS

## 2018-07-21 LAB — SARS CORONAVIRUS 2 (TAT 6-24 HRS): SARS Coronavirus 2: NEGATIVE

## 2018-07-21 NOTE — Progress Notes (Signed)
Pt denies SOB and chest pain. Pt stated that she is under the care of Dr. Claiborne Billings, Cardiology. Pt stated that she has not seen Dr. Claiborne Billings since 2018.  Pt denies having a stress test and cardiac cath. Pt denies having an EKG and chest x ray within the last year. Pt denies recent labs. Pt stated that her PCP is Dr[facility] Lona K[facility] at [facility]. Pt stated that she has to take her hydroxychloroquine (PLAQUENIL) with food.   Pt denies that she and family members tested positive for COVID-19 ( pt scheduled to take test today and reminded to quarantine).   Pt denies that she and family members experienced the following symptoms:  Cough yes/no: No Fever (>100.41F)  yes/no: No Runny nose yes/no: No Sore throat yes/no: No Difficulty breathing/shortness of breath  yes/no: No  Have you or a family member traveled in the last 14 days and where? yes/no: No  Pt reminded that hospital visitation restrictions are in effect and the importance of the restrictions.   Pt verbalized understanding of all pre-op instructions.   Pt chart forwarded to PA, Anesthesiology, for review; pt has a heart murmur.

## 2018-07-21 NOTE — Pre-Procedure Instructions (Signed)
[id number] - [employer], [place] [address] [place] [address] Phone: [phone] Fax: [phone]      Your procedure is scheduled on Monday 07-25-18  Report to [facility] Main Entrance "A" at 1015 A.M., and check in at the Admitting office.  Call this number if you have problems the morning of surgery:  [phone]  Call [phone] if you have any questions prior to your surgery date Monday-Friday 8am-4pm    Remember:  Do not eat or drink after midnight Sunday, July 24, 2018  Take these medicines the morning of surgery with A SIP OF WATER : amLODipine (NORVASC), rosuvastatin (CRESTOR), leflunomide (ARAVA) omeprazole (PRILOSEC) as needed  7 days prior to surgery STOP taking any Aspirin (unless otherwise instructed by your surgeon), Aleve, Naproxen, Ibuprofen, Motrin, Advil, Goody's, BC's, all herbal medications, fish oil, and all vitamins.    The Morning of Surgery  Do not wear jewelry, make-up or nail polish.  Do not wear lotions, powders, or perfumes/colognes, or deodorant  Do not shave 48 hours prior to surgery.   Do not bring valuables to the hospital.  [facility] is not responsible for any belongings or valuables.  If you are a smoker, DO NOT Smoke 24 hours prior to surgery IF you wear a CPAP at night please bring your mask, tubing, and machine the morning of surgery   Remember that you must have someone to transport you home after your surgery, and remain with you for 24 hours if you are discharged the same day.   Contacts, glasses, hearing aids, dentures or bridgework may not be worn into surgery.    Leave your suitcase in the car.  After surgery it may be brought to your room.  For patients admitted to the hospital, discharge time will be determined by your treatment team.  Patients discharged the day of surgery will not be allowed to drive home.    Special instructions:   [place]- Preparing For Surgery  Before  surgery, you can play an important role. Because skin is not sterile, your skin needs to be as free of germs as possible. You can reduce the number of germs on your skin by washing with CHG (chlorahexidine gluconate) Soap before surgery.  CHG is an antiseptic cleaner which kills germs and bonds with the skin to continue killing germs even after washing.    Oral Hygiene is also important to reduce your risk of infection.  Remember - BRUSH YOUR TEETH THE MORNING OF SURGERY WITH YOUR REGULAR TOOTHPASTE  Please do not use if you have an allergy to CHG or antibacterial soaps. If your skin becomes reddened/irritated stop using the CHG.  Do not shave (including legs and underarms) for at least 48 hours prior to first CHG shower. It is OK to shave your face.  Please follow these instructions carefully.   1. Shower the NIGHT BEFORE SURGERY and the MORNING OF SURGERY with CHG Soap.   2. If you chose to wash your hair, wash your hair first as usual with your normal shampoo.  3. After you shampoo, rinse your hair and body thoroughly to remove the shampoo.  4. Use CHG as you would any other liquid soap. You can apply CHG directly to the skin and wash gently with a scrungie or a clean washcloth.   5. Apply the CHG Soap to your body ONLY FROM THE NECK DOWN.  Do not use on open wounds or open sores. Avoid contact with your  eyes, ears, mouth and genitals (private parts). Wash Face and genitals (private parts)  with your normal soap.   6. Wash thoroughly, paying special attention to the area where your surgery will be performed.  7. Thoroughly rinse your body with warm water from the neck down.  8. DO NOT shower/wash with your normal soap after using and rinsing off the CHG Soap.  9. Pat yourself dry with a CLEAN TOWEL.  10. Wear CLEAN PAJAMAS to bed the night before surgery, wear comfortable clothes the morning of surgery  11. Place CLEAN SHEETS on your bed the night of your first shower and DO NOT SLEEP  WITH PETS.  Day of Surgery:  Do not apply any deodorants/lotions.  Please wear clean clothes to the hospital/surgery center.   Remember to brush your teeth WITH YOUR REGULAR TOOTHPASTE.   Please read over the following fact sheets that you were given.

## 2018-07-21 NOTE — Progress Notes (Signed)
Pt stated that she did not take her blood pressure medication this morning.

## 2018-07-22 NOTE — Anesthesia Preprocedure Evaluation (Addendum)
Anesthesia Evaluation  Patient identified by MRN, date of birth, ID band Patient awake    Reviewed: Allergy & Precautions, H&P , NPO status , Patient's Chart, lab work & pertinent test results  Airway Mallampati: II  TM Distance: >3 FB Neck ROM: Full    Dental no notable dental hx. (+) Edentulous Upper, Partial Lower, Dental Advisory Given   Pulmonary neg pulmonary ROS, former smoker,    Pulmonary exam normal breath sounds clear to auscultation       Cardiovascular hypertension, Pt. on medications  Rhythm:Regular Rate:Normal     Neuro/Psych negative neurological ROS  negative psych ROS   GI/Hepatic Neg liver ROS, GERD  Medicated and Controlled,  Endo/Other  negative endocrine ROS  Renal/GU negative Renal ROS  negative genitourinary   Musculoskeletal  (+) Arthritis , Osteoarthritis,    Abdominal   Peds  Hematology negative hematology ROS (+)   Anesthesia Other Findings   Reproductive/Obstetrics negative OB ROS                           Anesthesia Physical Anesthesia Plan  ASA: II  Anesthesia Plan: General   Post-op Pain Management:    Induction: Intravenous  PONV Risk Score and Plan: 4 or greater and Ondansetron, Dexamethasone and Midazolam  Airway Management Planned: Oral ETT  Additional Equipment:   Intra-op Plan:   Post-operative Plan: Extubation in OR  Informed Consent: I have reviewed the patients History and Physical, chart, labs and discussed the procedure including the risks, benefits and alternatives for the proposed anesthesia with the patient or authorized representative who has indicated his/her understanding and acceptance.     Dental advisory given  Plan Discussed with: CRNA  Anesthesia Plan Comments: (PAT note written 07/22/2018 by Myra Gianotti, PA-C. )       Anesthesia Quick Evaluation

## 2018-07-22 NOTE — Progress Notes (Signed)
Anesthesia Chart Review:  Case: [id number] Date/Time: 07/25/18 1201   Procedure: PLIF - L4-L5 (N/A Back)   Anesthesia type: General   Pre-op diagnosis: Spondylolisthesis   Location: [facility] OR ROOM 38 / [facility] [place] OR   Surgeon: Earnie Larsson, MD      DISCUSSION: Patient is a 68 year old female scheduled for the above procedure.  History includes former smoker, murmur (mild AI 2018), former smoker, RA, HLD, HTN, GERD, primary hyperparathyroidism (s/p parathyroidectomy, 2-1/2 glands 03/07/07).  She denied chest pain and SOB at PAT RN visit.  If no acute changes and I would anticipate that she could proceed as planned. 07/21/18 presurgical cover test was negative.   VS: BP (!) 167/83   Pulse 78   Temp 36.9 C   Resp 20   Ht 5\' 6"  (1.676 m)   Wt 84.4 kg   SpO2 99%   BMI 30.02 kg/m     PROVIDERS: Lawerance Cruel, MD is PCP  Shelva Majestic, MD is cardiologist. Last seen for murmur/mild AI follow-up on 09/14/16 by Bernerd Pho, [place]. "Will follow her AI at a distance with plans for repeat echo in several years unless she develops acute symptoms."   LABS: Labs reviewed: Acceptable for surgery. (all labs ordered are listed, but only abnormal results are displayed)  Labs Reviewed  BASIC METABOLIC PANEL - Abnormal; Notable for the following components:      Result Value   Glucose, Bld 105 (*)    All other components within normal limits  CBC WITH DIFFERENTIAL/PLATELET - Abnormal; Notable for the following components:   WBC 3.2 (*)    Platelets 140 (*)    Neutro Abs 1.5 (*)    All other components within normal limits  SURGICAL PCR SCREEN  TYPE AND SCREEN  ABO/RH    EKG: 07/21/18: Sinus bradycardia with Premature atrial complexes Moderate voltage criteria for LVH, may be normal variant Borderline ECG No significant change since last tracing Confirmed by Candee Furbish [phone]) on 07/21/2018 3:57:37 PM   CV: Echo 07/14/16: Study Conclusions - Left ventricle: The cavity size was normal. Wall  thickness was   normal. Systolic function was normal. The estimated ejection   fraction was in the range of 60% to 65%. Wall motion was normal;   there were no regional wall motion abnormalities. Doppler   parameters are consistent with abnormal left ventricular   relaxation (grade 1 diastolic dysfunction). - Aortic valve: The valve appears to be grossly normal. There was    mild regurgitation.   Past Medical History:  Diagnosis Date  . DDD (degenerative disc disease), lumbar   . GERD (gastroesophageal reflux disease)   . Heart murmur    a. 06/2016: echo showing EF of 60-65% with Grade 1 DD and mild AI.   Marland Kitchen Hyperlipidemia   . Hypertension   . Pneumonia   . RA (rheumatoid arthritis) ([place])   . Spondylolisthesis of lumbar region   . Wears dentures   . Wears glasses     Past Surgical History:  Procedure Laterality Date  . BREAST SURGERY    . CERVICAL POLYPECTOMY N/A 05/30/2015   Procedure: POLYPECTOMY;  Surgeon: Christophe Louis, MD;  Location: [facility];  Service: Gynecology;  Laterality: N/A;  . COLONOSCOPY    . DIAGNOSTIC LAPAROSCOPY    . DILATATION & CURETTAGE/HYSTEROSCOPY WITH MYOSURE N/A 05/30/2015   Procedure: DILATATION & CURETTAGE/HYSTEROSCOPY WITH MYOSURE;  Surgeon: Christophe Louis, MD;  Location: [facility];  Service: Gynecology;  Laterality: N/A;  . LAPAROSCOPY FOR ECTOPIC PREGNANCY    .  MULTIPLE TOOTH EXTRACTIONS    . THYROID SURGERY    . TUBAL LIGATION      MEDICATIONS: . amLODipine (NORVASC) 2.5 MG tablet  . hydroxychloroquine (PLAQUENIL) 200 MG tablet  . ibuprofen (ADVIL,MOTRIN) 800 MG tablet  . leflunomide (ARAVA) 20 MG tablet  . Multiple Vitamin (MULTIVITAMIN WITH MINERALS) TABS tablet  . omeprazole (PRILOSEC) 20 MG capsule  . Polyethyl Glycol-Propyl Glycol (SYSTANE OP)  . rosuvastatin (CRESTOR) 20 MG tablet  . Tetrahydrozoline HCl (VISINE OP)   No current facility-administered medications for this encounter.     Shonna Chock, PA-C Surgical Short  Stay/Anesthesiology [facility] Phone [phone] [facility] Phone [phone] 07/22/2018 11:47 AM

## 2018-07-25 ENCOUNTER — Inpatient Hospital Stay (HOSPITAL_COMMUNITY)
Admission: RE | Admit: 2018-07-25 | Discharge: 2018-07-26 | DRG: 455 | Disposition: A | Discharge status: Home / Self Care | Payer: Medicare Other | Attending: Neurosurgery | Admitting: Neurosurgery

## 2018-07-25 ENCOUNTER — Inpatient Hospital Stay (HOSPITAL_COMMUNITY): Payer: Medicare Other | Admitting: Certified Registered"

## 2018-07-25 ENCOUNTER — Other Ambulatory Visit: Payer: Self-pay

## 2018-07-25 ENCOUNTER — Encounter (HOSPITAL_COMMUNITY)
Admission: RE | Disposition: A | Discharge status: Home / Self Care | Payer: Self-pay | Source: Home / Self Care | Attending: Neurosurgery

## 2018-07-25 ENCOUNTER — Inpatient Hospital Stay (HOSPITAL_COMMUNITY): Payer: Medicare Other | Admitting: Vascular Surgery

## 2018-07-25 ENCOUNTER — Inpatient Hospital Stay (HOSPITAL_COMMUNITY): Payer: Medicare Other

## 2018-07-25 ENCOUNTER — Encounter (HOSPITAL_COMMUNITY): Payer: Self-pay

## 2018-07-25 DIAGNOSIS — M48061 Spinal stenosis, lumbar region without neurogenic claudication: Secondary | ICD-10-CM | POA: Diagnosis present

## 2018-07-25 DIAGNOSIS — M5416 Radiculopathy, lumbar region: Secondary | ICD-10-CM | POA: Diagnosis present

## 2018-07-25 DIAGNOSIS — M4316 Spondylolisthesis, lumbar region: Principal | ICD-10-CM | POA: Diagnosis present

## 2018-07-25 DIAGNOSIS — K219 Gastro-esophageal reflux disease without esophagitis: Secondary | ICD-10-CM | POA: Diagnosis present

## 2018-07-25 DIAGNOSIS — Z419 Encounter for procedure for purposes other than remedying health state, unspecified: Secondary | ICD-10-CM

## 2018-07-25 DIAGNOSIS — M069 Rheumatoid arthritis, unspecified: Secondary | ICD-10-CM | POA: Diagnosis present

## 2018-07-25 DIAGNOSIS — Z79899 Other long term (current) drug therapy: Secondary | ICD-10-CM | POA: Diagnosis not present

## 2018-07-25 DIAGNOSIS — Z87891 Personal history of nicotine dependence: Secondary | ICD-10-CM

## 2018-07-25 DIAGNOSIS — M4310 Spondylolisthesis, site unspecified: Secondary | ICD-10-CM | POA: Diagnosis present

## 2018-07-25 DIAGNOSIS — I10 Essential (primary) hypertension: Secondary | ICD-10-CM | POA: Diagnosis present

## 2018-07-25 DIAGNOSIS — E785 Hyperlipidemia, unspecified: Secondary | ICD-10-CM | POA: Diagnosis present

## 2018-07-25 SURGERY — POSTERIOR LUMBAR FUSION 1 LEVEL
Anesthesia: General | Site: Back

## 2018-07-25 MED ORDER — FENTANYL CITRATE (PF) 100 MCG/2ML IJ SOLN
INTRAMUSCULAR | Status: DC | PRN
  Administered 2018-07-25: 100 ug via INTRAVENOUS
  Administered 2018-07-25 (×2): 50 ug via INTRAVENOUS

## 2018-07-25 MED ORDER — CEFAZOLIN SODIUM-DEXTROSE 2-4 GM/100ML-% IV SOLN
2.0000 g | INTRAVENOUS | Status: AC
  Administered 2018-07-25: 2 g via INTRAVENOUS

## 2018-07-25 MED ORDER — PANTOPRAZOLE SODIUM 40 MG PO TBEC
80.0000 mg | DELAYED_RELEASE_TABLET | Freq: Every day | ORAL | Status: DC
  Administered 2018-07-25 – 2018-07-26 (×2): 80 mg via ORAL
  Filled 2018-07-25 (×2): qty 2

## 2018-07-25 MED ORDER — VANCOMYCIN HCL 1 G IV SOLR
INTRAVENOUS | Status: DC | PRN
  Administered 2018-07-25: 1000 mg

## 2018-07-25 MED ORDER — HYDROCODONE-ACETAMINOPHEN 10-325 MG PO TABS
ORAL_TABLET | ORAL | Status: AC
  Filled 2018-07-25: qty 1

## 2018-07-25 MED ORDER — LEFLUNOMIDE 20 MG PO TABS
20.0000 mg | ORAL_TABLET | Freq: Every day | ORAL | Status: DC
  Administered 2018-07-26: 20 mg via ORAL
  Filled 2018-07-25 (×2): qty 1

## 2018-07-25 MED ORDER — ALBUMIN HUMAN 5 % IV SOLN
INTRAVENOUS | Status: DC | PRN
  Administered 2018-07-25: 14:00:00 via INTRAVENOUS

## 2018-07-25 MED ORDER — FLEET ENEMA 7-19 GM/118ML RE ENEM: 1.0000 | ENEMA | Freq: Once | RECTAL | Status: DC | PRN

## 2018-07-25 MED ORDER — 0.9 % SODIUM CHLORIDE (POUR BTL) OPTIME
TOPICAL | Status: DC | PRN
  Administered 2018-07-25: 1000 mL

## 2018-07-25 MED ORDER — THROMBIN 5000 UNITS EX SOLR
CUTANEOUS | Status: AC
  Filled 2018-07-25: qty 5000

## 2018-07-25 MED ORDER — SODIUM CHLORIDE 0.9% FLUSH: 3.0000 mL | INTRAVENOUS | Status: DC | PRN

## 2018-07-25 MED ORDER — SODIUM CHLORIDE 0.9% FLUSH: 3.0000 mL | Freq: Two times a day (BID) | INTRAVENOUS | Status: DC

## 2018-07-25 MED ORDER — LACTATED RINGERS IV SOLN
INTRAVENOUS | Status: DC
  Administered 2018-07-25: 11:00:00 via INTRAVENOUS

## 2018-07-25 MED ORDER — PROPOFOL 10 MG/ML IV BOLUS
INTRAVENOUS | Status: DC | PRN
  Administered 2018-07-25: 100 mg via INTRAVENOUS
  Administered 2018-07-25: 50 mg via INTRAVENOUS

## 2018-07-25 MED ORDER — CHLORHEXIDINE GLUCONATE CLOTH 2 % EX PADS: 6.0000 | MEDICATED_PAD | Freq: Once | CUTANEOUS | Status: DC

## 2018-07-25 MED ORDER — LIDOCAINE 2% (20 MG/ML) 5 ML SYRINGE
INTRAMUSCULAR | Status: DC | PRN
  Administered 2018-07-25: 40 mg via INTRAVENOUS
  Administered 2018-07-25: 60 mg via INTRAVENOUS

## 2018-07-25 MED ORDER — CEFAZOLIN SODIUM-DEXTROSE 1-4 GM/50ML-% IV SOLN
1.0000 g | Freq: Three times a day (TID) | INTRAVENOUS | Status: AC
  Administered 2018-07-25 – 2018-07-26 (×2): 1 g via INTRAVENOUS
  Filled 2018-07-25 (×2): qty 50

## 2018-07-25 MED ORDER — ACETAMINOPHEN 325 MG PO TABS: 650.0000 mg | ORAL_TABLET | ORAL | Status: DC | PRN

## 2018-07-25 MED ORDER — ONDANSETRON HCL 4 MG/2ML IJ SOLN
INTRAMUSCULAR | Status: DC | PRN
  Administered 2018-07-25: 4 mg via INTRAVENOUS

## 2018-07-25 MED ORDER — ROSUVASTATIN CALCIUM 20 MG PO TABS
20.0000 mg | ORAL_TABLET | Freq: Every day | ORAL | Status: DC
  Administered 2018-07-26: 20 mg via ORAL
  Filled 2018-07-25: qty 1

## 2018-07-25 MED ORDER — DEXAMETHASONE SODIUM PHOSPHATE 10 MG/ML IJ SOLN
INTRAMUSCULAR | Status: AC
  Filled 2018-07-25: qty 1

## 2018-07-25 MED ORDER — BUPIVACAINE HCL (PF) 0.25 % IJ SOLN
INTRAMUSCULAR | Status: AC
  Filled 2018-07-25: qty 30

## 2018-07-25 MED ORDER — ACETAMINOPHEN 500 MG PO TABS: 1000.0000 mg | ORAL_TABLET | Freq: Once | ORAL | Status: DC

## 2018-07-25 MED ORDER — MIDAZOLAM HCL 5 MG/5ML IJ SOLN
INTRAMUSCULAR | Status: DC | PRN
  Administered 2018-07-25: 2 mg via INTRAVENOUS

## 2018-07-25 MED ORDER — LACTATED RINGERS IV SOLN
INTRAVENOUS | Status: DC | PRN
  Administered 2018-07-25 (×2): via INTRAVENOUS

## 2018-07-25 MED ORDER — FENTANYL CITRATE (PF) 250 MCG/5ML IJ SOLN
INTRAMUSCULAR | Status: AC
  Filled 2018-07-25: qty 5

## 2018-07-25 MED ORDER — ONDANSETRON HCL 4 MG PO TABS
4.0000 mg | ORAL_TABLET | Freq: Four times a day (QID) | ORAL | Status: DC | PRN
  Administered 2018-07-26: 4 mg via ORAL
  Filled 2018-07-25 (×2): qty 1

## 2018-07-25 MED ORDER — SUGAMMADEX SODIUM 200 MG/2ML IV SOLN
INTRAVENOUS | Status: DC | PRN
  Administered 2018-07-25: 200 mg via INTRAVENOUS

## 2018-07-25 MED ORDER — GLYCOPYRROLATE PF 0.2 MG/ML IJ SOSY
PREFILLED_SYRINGE | INTRAMUSCULAR | Status: DC | PRN
  Administered 2018-07-25: .1 mg via INTRAVENOUS

## 2018-07-25 MED ORDER — HYDROMORPHONE HCL 1 MG/ML IJ SOLN
INTRAMUSCULAR | Status: AC
  Filled 2018-07-25: qty 1

## 2018-07-25 MED ORDER — DIAZEPAM 5 MG PO TABS: 5.0000 mg | ORAL_TABLET | Freq: Four times a day (QID) | ORAL | Status: DC | PRN

## 2018-07-25 MED ORDER — HYDROCODONE-ACETAMINOPHEN 10-325 MG PO TABS
1.0000 | ORAL_TABLET | ORAL | Status: DC | PRN
  Administered 2018-07-25: 1 via ORAL

## 2018-07-25 MED ORDER — BUPIVACAINE HCL (PF) 0.25 % IJ SOLN
INTRAMUSCULAR | Status: DC | PRN
  Administered 2018-07-25: 20 mL

## 2018-07-25 MED ORDER — AMLODIPINE BESYLATE 5 MG PO TABS
2.5000 mg | ORAL_TABLET | Freq: Every day | ORAL | Status: DC
  Administered 2018-07-26: 2.5 mg via ORAL
  Filled 2018-07-25 (×2): qty 1

## 2018-07-25 MED ORDER — MENTHOL 3 MG MT LOZG: 1.0000 | LOZENGE | OROMUCOSAL | Status: DC | PRN

## 2018-07-25 MED ORDER — EPHEDRINE 5 MG/ML INJ
INTRAVENOUS | Status: AC
  Filled 2018-07-25: qty 10

## 2018-07-25 MED ORDER — DEXAMETHASONE SODIUM PHOSPHATE 10 MG/ML IJ SOLN: 10.0000 mg | INTRAMUSCULAR | Status: DC

## 2018-07-25 MED ORDER — HYDROMORPHONE HCL 1 MG/ML IJ SOLN
0.2500 mg | INTRAMUSCULAR | Status: DC | PRN
  Administered 2018-07-25 (×2): 0.25 mg via INTRAVENOUS

## 2018-07-25 MED ORDER — LIDOCAINE 2% (20 MG/ML) 5 ML SYRINGE
INTRAMUSCULAR | Status: AC
  Filled 2018-07-25: qty 5

## 2018-07-25 MED ORDER — ONDANSETRON HCL 4 MG/2ML IJ SOLN
INTRAMUSCULAR | Status: AC
  Filled 2018-07-25: qty 2

## 2018-07-25 MED ORDER — SODIUM CHLORIDE 0.9 % IV SOLN
INTRAVENOUS | Status: DC | PRN
  Administered 2018-07-25: 40 ug/min via INTRAVENOUS

## 2018-07-25 MED ORDER — MIDAZOLAM HCL 2 MG/2ML IJ SOLN
INTRAMUSCULAR | Status: AC
  Filled 2018-07-25: qty 2

## 2018-07-25 MED ORDER — HYDROXYCHLOROQUINE SULFATE 200 MG PO TABS
200.0000 mg | ORAL_TABLET | Freq: Two times a day (BID) | ORAL | Status: DC
  Administered 2018-07-26: 09:00:00 200 mg via ORAL
  Filled 2018-07-25 (×4): qty 1

## 2018-07-25 MED ORDER — THROMBIN 20000 UNITS EX SOLR
CUTANEOUS | Status: DC | PRN
  Administered 2018-07-25: 20 mL via TOPICAL

## 2018-07-25 MED ORDER — DEXAMETHASONE SODIUM PHOSPHATE 10 MG/ML IJ SOLN
INTRAMUSCULAR | Status: DC | PRN
  Administered 2018-07-25: 10 mg via INTRAVENOUS

## 2018-07-25 MED ORDER — PHENYLEPHRINE 40 MCG/ML (10ML) SYRINGE FOR IV PUSH (FOR BLOOD PRESSURE SUPPORT)
PREFILLED_SYRINGE | INTRAVENOUS | Status: AC
  Filled 2018-07-25: qty 10

## 2018-07-25 MED ORDER — ROCURONIUM BROMIDE 10 MG/ML (PF) SYRINGE
PREFILLED_SYRINGE | INTRAVENOUS | Status: AC
  Filled 2018-07-25: qty 10

## 2018-07-25 MED ORDER — NAPHAZOLINE-PHENIRAMINE 0.025-0.3 % OP SOLN
1.0000 [drp] | Freq: Every day | OPHTHALMIC | Status: DC | PRN
  Filled 2018-07-25: qty 5

## 2018-07-25 MED ORDER — HYDROMORPHONE HCL 1 MG/ML IJ SOLN: 1.0000 mg | INTRAMUSCULAR | Status: DC | PRN

## 2018-07-25 MED ORDER — ACETAMINOPHEN 650 MG RE SUPP: 650.0000 mg | RECTAL | Status: DC | PRN

## 2018-07-25 MED ORDER — POLYETHYLENE GLYCOL 3350 17 G PO PACK: 17.0000 g | PACK | Freq: Every day | ORAL | Status: DC | PRN

## 2018-07-25 MED ORDER — SODIUM CHLORIDE 0.9 % IV SOLN: 250.0000 mL | INTRAVENOUS | Status: DC

## 2018-07-25 MED ORDER — THROMBIN 20000 UNITS EX SOLR
CUTANEOUS | Status: AC
  Filled 2018-07-25: qty 20000

## 2018-07-25 MED ORDER — OXYCODONE HCL 5 MG PO TABS
10.0000 mg | ORAL_TABLET | ORAL | Status: DC | PRN
  Administered 2018-07-25 – 2018-07-26 (×2): 10 mg via ORAL
  Filled 2018-07-25 (×2): qty 2

## 2018-07-25 MED ORDER — SUCCINYLCHOLINE CHLORIDE 200 MG/10ML IV SOSY
PREFILLED_SYRINGE | INTRAVENOUS | Status: AC
  Filled 2018-07-25: qty 10

## 2018-07-25 MED ORDER — GLYCOPYRROLATE PF 0.2 MG/ML IJ SOSY
PREFILLED_SYRINGE | INTRAMUSCULAR | Status: AC
  Filled 2018-07-25: qty 1

## 2018-07-25 MED ORDER — CEFAZOLIN SODIUM-DEXTROSE 2-4 GM/100ML-% IV SOLN
INTRAVENOUS | Status: AC
  Filled 2018-07-25: qty 100

## 2018-07-25 MED ORDER — SODIUM CHLORIDE 0.9 % IV SOLN
INTRAVENOUS | Status: DC | PRN
  Administered 2018-07-25: 500 mL

## 2018-07-25 MED ORDER — ONDANSETRON HCL 4 MG/2ML IJ SOLN
4.0000 mg | Freq: Four times a day (QID) | INTRAMUSCULAR | Status: DC | PRN
  Administered 2018-07-25: 4 mg via INTRAVENOUS
  Filled 2018-07-25: qty 2

## 2018-07-25 MED ORDER — PHENYLEPHRINE 40 MCG/ML (10ML) SYRINGE FOR IV PUSH (FOR BLOOD PRESSURE SUPPORT)
PREFILLED_SYRINGE | INTRAVENOUS | Status: DC | PRN
  Administered 2018-07-25 (×2): 80 ug via INTRAVENOUS
  Administered 2018-07-25: 40 ug via INTRAVENOUS
  Administered 2018-07-25: 80 ug via INTRAVENOUS

## 2018-07-25 MED ORDER — EPHEDRINE SULFATE-NACL 50-0.9 MG/10ML-% IV SOSY
PREFILLED_SYRINGE | INTRAVENOUS | Status: DC | PRN
  Administered 2018-07-25: 5 mg via INTRAVENOUS

## 2018-07-25 MED ORDER — PROPOFOL 10 MG/ML IV BOLUS
INTRAVENOUS | Status: AC
  Filled 2018-07-25: qty 20

## 2018-07-25 MED ORDER — ROCURONIUM BROMIDE 50 MG/5ML IV SOSY
PREFILLED_SYRINGE | INTRAVENOUS | Status: DC | PRN
  Administered 2018-07-25: 50 mg via INTRAVENOUS

## 2018-07-25 MED ORDER — ADULT MULTIVITAMIN W/MINERALS CH
1.0000 | ORAL_TABLET | ORAL | Status: DC
  Administered 2018-07-25: 18:00:00 1 via ORAL
  Filled 2018-07-25: qty 1

## 2018-07-25 MED ORDER — VANCOMYCIN HCL 1000 MG IV SOLR
INTRAVENOUS | Status: AC
  Filled 2018-07-25: qty 1000

## 2018-07-25 MED ORDER — POLYETHYL GLYCOL-PROPYL GLYCOL 0.4-0.3 % OP GEL
Freq: Every day | OPHTHALMIC | Status: DC | PRN
  Filled 2018-07-25: qty 10

## 2018-07-25 MED ORDER — PHENOL 1.4 % MT LIQD: 1.0000 | OROMUCOSAL | Status: DC | PRN

## 2018-07-25 MED ORDER — HYDROCODONE-ACETAMINOPHEN 5-325 MG PO TABS
ORAL_TABLET | ORAL | Status: AC
  Filled 2018-07-25: qty 1

## 2018-07-25 MED ORDER — BISACODYL 10 MG RE SUPP: 10.0000 mg | Freq: Every day | RECTAL | Status: DC | PRN

## 2018-07-25 MED ORDER — SUCCINYLCHOLINE CHLORIDE 200 MG/10ML IV SOSY
PREFILLED_SYRINGE | INTRAVENOUS | Status: DC | PRN
  Administered 2018-07-25: 80 mg via INTRAVENOUS

## 2018-07-25 SURGICAL SUPPLY — 67 items
ADH SKN CLS APL DERMABOND .7 (GAUZE/BANDAGES/DRESSINGS) ×1
APL SKNCLS STERI-STRIP NONHPOA (GAUZE/BANDAGES/DRESSINGS) ×1
BAG DECANTER FOR FLEXI CONT (MISCELLANEOUS) ×2 IMPLANT
BENZOIN TINCTURE PRP APPL 2/3 (GAUZE/BANDAGES/DRESSINGS) ×2 IMPLANT
BLADE CLIPPER SURG (BLADE) IMPLANT
BUR CUTTER 7.0 ROUND (BURR) IMPLANT
BUR MATCHSTICK NEURO 3.0 LAGG (BURR) ×2 IMPLANT
CANISTER SUCT 3000ML PPV (MISCELLANEOUS) ×2 IMPLANT
CAP LCK SPNE (Orthopedic Implant) ×4 IMPLANT
CAP LOCK SPINE RADIUS (Orthopedic Implant) IMPLANT
CAP LOCKING (Orthopedic Implant) ×8 IMPLANT
CARTRIDGE OIL MAESTRO DRILL (MISCELLANEOUS) ×1 IMPLANT
CLSR STERI-STRIP ANTIMIC 1/2X4 (GAUZE/BANDAGES/DRESSINGS) ×1 IMPLANT
CONT SPEC 4OZ CLIKSEAL STRL BL (MISCELLANEOUS) ×2 IMPLANT
COVER BACK TABLE 60X90IN (DRAPES) ×2 IMPLANT
COVER WAND RF STERILE (DRAPES) ×2 IMPLANT
DECANTER SPIKE VIAL GLASS SM (MISCELLANEOUS) ×2 IMPLANT
DERMABOND ADVANCED (GAUZE/BANDAGES/DRESSINGS) ×1
DERMABOND ADVANCED .7 DNX12 (GAUZE/BANDAGES/DRESSINGS) ×1 IMPLANT
DEVICE INTERBODY ELEVATE 10X23 (Cage) ×2 IMPLANT
DIFFUSER DRILL AIR PNEUMATIC (MISCELLANEOUS) ×2 IMPLANT
DRAPE C-ARM 42X72 X-RAY (DRAPES) ×4 IMPLANT
DRAPE HALF SHEET 40X57 (DRAPES) IMPLANT
DRAPE LAPAROTOMY 100X72X124 (DRAPES) ×2 IMPLANT
DRAPE SURG 17X23 STRL (DRAPES) ×8 IMPLANT
DRSG OPSITE POSTOP 4X6 (GAUZE/BANDAGES/DRESSINGS) ×2 IMPLANT
DURAPREP 26ML APPLICATOR (WOUND CARE) ×2 IMPLANT
ELECT REM PT RETURN 9FT ADLT (ELECTROSURGICAL) ×2
ELECTRODE REM PT RTRN 9FT ADLT (ELECTROSURGICAL) ×1 IMPLANT
EVACUATOR 1/8 PVC DRAIN (DRAIN) IMPLANT
GAUZE 4X4 16PLY RFD (DISPOSABLE) IMPLANT
GAUZE SPONGE 4X4 12PLY STRL (GAUZE/BANDAGES/DRESSINGS) IMPLANT
GLOVE BIO SURGEON STRL SZ 6.5 (GLOVE) ×1 IMPLANT
GLOVE BIOGEL PI IND STRL 6.5 (GLOVE) IMPLANT
GLOVE BIOGEL PI IND STRL 7.0 (GLOVE) IMPLANT
GLOVE BIOGEL PI IND STRL 7.5 (GLOVE) IMPLANT
GLOVE BIOGEL PI INDICATOR 6.5 (GLOVE) ×1
GLOVE BIOGEL PI INDICATOR 7.0 (GLOVE) ×2
GLOVE BIOGEL PI INDICATOR 7.5 (GLOVE) ×2
GLOVE ECLIPSE 9.0 STRL (GLOVE) ×4 IMPLANT
GLOVE EXAM NITRILE XL STR (GLOVE) IMPLANT
GLOVE SURG SS PI 7.0 STRL IVOR (GLOVE) ×4 IMPLANT
GOWN STRL REUS W/ TWL LRG LVL3 (GOWN DISPOSABLE) IMPLANT
GOWN STRL REUS W/ TWL XL LVL3 (GOWN DISPOSABLE) ×2 IMPLANT
GOWN STRL REUS W/TWL 2XL LVL3 (GOWN DISPOSABLE) IMPLANT
GOWN STRL REUS W/TWL LRG LVL3 (GOWN DISPOSABLE) ×4
GOWN STRL REUS W/TWL XL LVL3 (GOWN DISPOSABLE) ×4
KIT BASIN OR (CUSTOM PROCEDURE TRAY) ×2 IMPLANT
KIT TURNOVER KIT B (KITS) ×2 IMPLANT
MILL MEDIUM DISP (BLADE) ×2 IMPLANT
NEEDLE HYPO 22GX1.5 SAFETY (NEEDLE) ×2 IMPLANT
NS IRRIG 1000ML POUR BTL (IV SOLUTION) ×2 IMPLANT
OIL CARTRIDGE MAESTRO DRILL (MISCELLANEOUS) ×2
PACK LAMINECTOMY NEURO (CUSTOM PROCEDURE TRAY) ×2 IMPLANT
ROD RADIUS 40MM (Neuro Prosthesis/Implant) ×4 IMPLANT
ROD SPNL 40X5.5XNS TI RDS (Neuro Prosthesis/Implant) IMPLANT
SCREW 5.75X45MM (Screw) ×4 IMPLANT
SPONGE SURGIFOAM ABS GEL 100 (HEMOSTASIS) ×2 IMPLANT
STRIP CLOSURE SKIN 1/2X4 (GAUZE/BANDAGES/DRESSINGS) ×4 IMPLANT
SUT VIC AB 0 CT1 18XCR BRD8 (SUTURE) ×2 IMPLANT
SUT VIC AB 0 CT1 8-18 (SUTURE) ×2
SUT VIC AB 2-0 CT1 18 (SUTURE) ×2 IMPLANT
SUT VIC AB 3-0 SH 8-18 (SUTURE) ×3 IMPLANT
TOWEL GREEN STERILE (TOWEL DISPOSABLE) ×2 IMPLANT
TOWEL GREEN STERILE FF (TOWEL DISPOSABLE) ×2 IMPLANT
TRAY FOLEY MTR SLVR 16FR STAT (SET/KITS/TRAYS/PACK) ×2 IMPLANT
WATER STERILE IRR 1000ML POUR (IV SOLUTION) ×2 IMPLANT

## 2018-07-25 NOTE — Transfer of Care (Signed)
Immediate Anesthesia Transfer of Care Note  Patient: VAIL BASISTA  Procedure(s) Performed: POSTERIOR LUMBAR INTERBODY FUSION - LUMBAR FOUR-LUMBAR FIVE (N/A Back)  Patient Location: PACU  Anesthesia Type:General  Level of Consciousness: awake and patient cooperative  Airway & Oxygen Therapy: Patient Spontanous Breathing and Patient connected to nasal cannula oxygen  Post-op Assessment: Report given to RN and Post -op Vital signs reviewed and stable  Post vital signs: Reviewed and stable  Last Vitals:  Vitals Value Taken Time  BP    Temp    Pulse 71 07/25/18 1456  Resp 12 07/25/18 1456  SpO2 100 % 07/25/18 1456  Vitals shown include unvalidated device data.  Last Pain:  Vitals:   07/25/18 1043  TempSrc:   PainSc: 4       Patients Stated Pain Goal: 0 (48/18/56 3149)  Complications: No apparent anesthesia complications

## 2018-07-25 NOTE — Anesthesia Procedure Notes (Signed)
Procedure Name: Intubation Date/Time: 07/25/2018 12:52 PM Performed by: Orlie Dakin, CRNA Pre-anesthesia Checklist: Patient identified, Emergency Drugs available, Suction available and Patient being monitored Patient Re-evaluated:Patient Re-evaluated prior to induction Oxygen Delivery Method: Circle system utilized Preoxygenation: Pre-oxygenation with 100% oxygen Induction Type: IV induction and Rapid sequence Laryngoscope Size: Miller and 3 Grade View: Grade I Tube type: Oral Tube size: 7.0 mm Number of attempts: 1 Airway Equipment and Method: Stylet Placement Confirmation: ETT inserted through vocal cords under direct vision,  positive ETCO2 and breath sounds checked- equal and bilateral Secured at: 22 cm Tube secured with: Tape Dental Injury: Teeth and Oropharynx as per pre-operative assessment  Comments: RSI due to Covid-19 pandemic concerns.

## 2018-07-25 NOTE — Brief Op Note (Signed)
07/25/2018  2:59 PM  PATIENT:  Sonia Baller  68 y.o. female  PRE-OPERATIVE DIAGNOSIS:  Spondylolisthesis  POST-OPERATIVE DIAGNOSIS:  Spondylolisthesis  PROCEDURE:  Procedure(s): POSTERIOR LUMBAR INTERBODY FUSION - LUMBAR FOUR-LUMBAR FIVE (N/A)  SURGEON:  Surgeon(s) and Role:    * Earnie Larsson, MD - Primary  PHYSICIAN ASSISTANT:   ASSISTANTSReinaldo Meeker, NP   ANESTHESIA:   general  EBL:  200 mL   BLOOD ADMINISTERED:none  DRAINS: none   LOCAL MEDICATIONS USED:  MARCAINE     SPECIMEN:  No Specimen  DISPOSITION OF SPECIMEN:  N/A  COUNTS:  YES  TOURNIQUET:  * No tourniquets in log *  DICTATION: .Dragon Dictation  PLAN OF CARE: Admit to inpatient   PATIENT DISPOSITION:  PACU - hemodynamically stable.   Delay start of Pharmacological VTE agent (>24hrs) due to surgical blood loss or risk of bleeding: yes

## 2018-07-25 NOTE — Anesthesia Postprocedure Evaluation (Signed)
Anesthesia Post Note  Patient: Michelle Castaneda  Procedure(s) Performed: POSTERIOR LUMBAR INTERBODY FUSION - LUMBAR FOUR-LUMBAR FIVE (N/A Back)     Patient location during evaluation: PACU Anesthesia Type: General Level of consciousness: awake and alert Pain management: pain level controlled Vital Signs Assessment: post-procedure vital signs reviewed and stable Respiratory status: spontaneous breathing, nonlabored ventilation and respiratory function stable Cardiovascular status: blood pressure returned to baseline and stable Postop Assessment: no apparent nausea or vomiting Anesthetic complications: no    Last Vitals:  Vitals:   07/25/18 1525 07/25/18 1540  BP: 119/73 112/64  Pulse: 75 75  Resp: 14 (!) 22  Temp:    SpO2: 97% 97%    Last Pain:  Vitals:   07/25/18 1525  TempSrc:   PainSc: 8                  Olajuwon Fosdick,W. EDMOND

## 2018-07-25 NOTE — H&P (Signed)
Michelle Castaneda is an 68 y.o. female.   Chief Complaint: Back pain HPI: 68 year old female with chronic and progressive lower back pain with radiation into her lower extremities left greater than right.  Work-up demonstrates evidence of an unstable grade 1 degenerative spondylolisthesis at L4-5 (patient has transitional anatomy) patient also with some significant disc generation L2-3 and L3-4 but no areas of high-grade stenosis at these levels.  Patient is failed conservative management and presents now for decompression and fusion at L4-5 in hopes of improving her symptoms.  Past Medical History:  Diagnosis Date  . DDD (degenerative disc disease), lumbar   . GERD (gastroesophageal reflux disease)   . Heart murmur    a. 06/2016: echo showing EF of 60-65% with Grade 1 DD and mild AI.   Marland Kitchen Hyperlipidemia   . Hypertension   . Pneumonia   . RA (rheumatoid arthritis) ([place])   . Spondylolisthesis of lumbar region   . Wears dentures   . Wears glasses     Past Surgical History:  Procedure Laterality Date  . BREAST SURGERY    . CERVICAL POLYPECTOMY N/A 05/30/2015   Procedure: POLYPECTOMY;  Surgeon: Christophe Louis, MD;  Location: [facility];  Service: Gynecology;  Laterality: N/A;  . COLONOSCOPY    . DIAGNOSTIC LAPAROSCOPY    . DILATATION & CURETTAGE/HYSTEROSCOPY WITH MYOSURE N/A 05/30/2015   Procedure: DILATATION & CURETTAGE/HYSTEROSCOPY WITH MYOSURE;  Surgeon: Christophe Louis, MD;  Location: [facility];  Service: Gynecology;  Laterality: N/A;  . LAPAROSCOPY FOR ECTOPIC PREGNANCY    . MULTIPLE TOOTH EXTRACTIONS    . THYROID SURGERY    . TUBAL LIGATION      Family History  Problem Relation Age of Onset  . Diabetes Mellitus II Mother   . Alcoholism Father   . Diabetes Father   . Diabetes Brother   . Heart disease Brother   . Lung cancer Maternal Grandfather   . Diabetes Paternal Grandmother    Social History:  reports that she has quit smoking. Her smoking use included cigarettes. She has never used  smokeless tobacco. She reports current alcohol use. She reports that she does not use drugs.  Allergies:  Allergies  Allergen Reactions  . Aspirin Other (See Comments)    Feels like she is foaming at the mouth.  . Iodine Itching and Swelling    To shellfish  . Naprosyn [Naproxen] Other (See Comments)    Feels like she is foaming at the mouth  . Tramadol Other (See Comments)    Nausea and dizziness    Medications Prior to Admission  Medication Sig Dispense Refill  . amLODipine (NORVASC) 2.5 MG tablet Take 2.5 mg by mouth daily.    . hydroxychloroquine (PLAQUENIL) 200 MG tablet Take 200 mg by mouth 2 (two) times daily.    Marland Kitchen leflunomide (ARAVA) 20 MG tablet Take 20 mg by mouth daily.    Marland Kitchen omeprazole (PRILOSEC) 20 MG capsule Take 1 capsule (20 mg total) by mouth daily. (Patient taking differently: Take 20 mg by mouth daily as needed (acid reflux). ) 30 capsule 0  . Polyethyl Glycol-Propyl Glycol (SYSTANE OP) Place 1 drop into both eyes daily as needed (itching).    . rosuvastatin (CRESTOR) 20 MG tablet TAKE 1 TABLET (20 MG TOTAL) BY MOUTH DAILY. NEED OV. LAST ATTEMPT. 90 tablet 1  . Tetrahydrozoline HCl (VISINE OP) Place 1 drop into both eyes daily as needed (itching eyes).    Marland Kitchen ibuprofen (ADVIL,MOTRIN) 800 MG tablet Take 1 tablet (800  mg total) by mouth every 8 (eight) hours as needed for mild pain or moderate pain. 30 tablet 1  . Multiple Vitamin (MULTIVITAMIN WITH MINERALS) TABS tablet Take 1 tablet by mouth every other day.      No results found for this or any previous visit (from the past 48 hour(s)). No results found.  Pertinent items noted in HPI and remainder of comprehensive ROS otherwise negative.  Blood pressure (!) 154/78, pulse 74, temperature 98.7 F (37.1 C), temperature source Oral, resp. rate 18, height 5\' 6"  (1.676 m), weight 84.4 kg, SpO2 99 %.  Patient is awake and alert.  She is oriented and appropriate.  Speech is fluent.  Judgment insight are intact.  Cranial  nerve function normal bilateral.  Motor examination of her extremities with some mild weakness of dorsiflexion her left foot otherwise normal strength.  Sensory examination with decrease sensation pinprick light touch in her left L5 dermatome.  Deep tendon reflexes normal active.  Gait antalgic.  Posture flexed peer examination head ears eyes nose throat is unremarked.  Chest and abdomen are benign.  Extremities free from injury or deformity. Assessment/Plan L4-5 unstable grade 1 degenerative spondylolisthesis with stenosis and radiculopathy.  Plan bilateral L4-5 decompressive laminotomies and foraminotomies followed by posterior lumbar interbody fusion utilizing interbody cages and local autograft cup with posterior arthrodesis utilizing nonsegmental pedicle screw fixation and local autograft.  Risks and benefits been explained.  Patient wishes to proceed.  A Tennille Montelongo 07/25/2018, 12:30 PM

## 2018-07-25 NOTE — Op Note (Signed)
Date of procedure: 07/25/2018  Date of dictation: Same  Service: Neurosurgery  Preoperative diagnosis: L4-5 degenerative grade 1 spondylolisthesis with stenosis and radiculopathy  Postoperative diagnosis: Same  Procedure Name: Bilateral L4-5 decompressive laminotomies and foraminotomies, more than would be required for simple interbody fusion alone.  L4-5 ponte osteotomies for sagittal plane balance restoration.  L4-5 posterior lumbar interbody fusion utilizing interbody cages and locally harvested autograft,  L4-5 posterior lateral arthrodesis utilizing nonsegmental pedicle screw sedation and local autograft.  Surgeon:Montrel Donahoe A.Quadasia Newsham, M.D.  Asst. Surgeon: Reinaldo Meeker, NP  Anesthesia: General  Indication: 68 year old female with severe back and bilateral lower extremity pain.  Work-up demonstrates evidence of an unstable grade 1 L4-5 degenerative spondylolisthesis with significant stenosis.  Patient has failed conservative management.  She presents now for L4-5 decompression and fusion.  Operative note: After induction of anesthesia, patient position prone and Wilson frame properly padded.  Lumbar region prepped and draped sterilely.  Incision made overlying L4-5.  Dissection performed bilaterally.  Retractors placed.  Fluoroscopy used.  Levels confirmed.  Decompressive laminotomies and facetectomies performed bilaterally using [facility] high-speed drill to remove the inferior two thirds of the lamina L4 the entire inferior facet and pars interarticularis of L4 bilaterally the majority of the superior facet of L5 bilaterally and the superior aspect of the L5 lamina lamina.  Ligament flavum elevated and resected.  Ponte osteotomies completed at L4-5 to facilitate sagittal plane restoration.  Bilateral discectomies then performed at L4-5.  Dissipates then prepared for interbody fusion.  With the dissipates distractor in the patient's right side the space was cleaned of all soft  tissue on the left.  A 10 mm [employer] expandable cage packed with local harvested autograft and impacted in the place and expanded to its full extent.  Distractor removed patient's right side.  The space prepared on the right side.  Morselized autograft packed into the interspace.  A second cage packed with autograft was then impacted into place and expanded to its full extent.  Pedicles at L4 and L5 identified using surface landmarks and intraoperative fluoroscopy with superficial bone around the pedicles and removed using high-speed drill.  Pedicle was then probed using pedicle all each pedicle tract was probed and found to be solid within the bone.  Each pedicle tract was then tapped with a screw tapped.  Screw temple was probed and found to be within the bone.  5.75 mm radius brand screws from [employer] were placed bilaterally at L4 and L5.  Final images reveal good position of the cages and the hardware at the proper level with normal alignment of the spine.  Wound is then irrigated one final time.  Transverse processes were decorticated.  Morselized autograft packed posterior laterally.  Short segment titanium rod placed over the screw heads at L4 and L5.  Locking caps placed over the screws.  Locking caps and engaged with a construct under mild compression.  Gelfoam placed over the laminotomy defects.  Vancomycin powder placed in the deep wound space.  Wounds and closed in layers with Vicryl sutures.  Steri-Strips and sterile dressing were applied.  No apparent complications.  Patient tolerated the procedure well and she returns to the recovery room postop. \

## 2018-07-26 MED ORDER — CYCLOBENZAPRINE HCL 10 MG PO TABS: 10.0000 mg | ORAL_TABLET | Freq: Three times a day (TID) | ORAL | 0 refills | Status: DC | PRN

## 2018-07-26 MED ORDER — OXYCODONE HCL 10 MG PO TABS: 5.0000 mg | ORAL_TABLET | ORAL | 0 refills | Status: DC | PRN

## 2018-07-26 NOTE — Progress Notes (Signed)
Pt doing well. Pt given D/C instructions with verbal understanding. Rx's were sent to pharmacy by MD. Pt's incision is clean and dry with no sign of infection. Pt's IV was removed prior to D/C. Pt D/C'd home via wheelchair per MD order. Pt is stable @ D/C and has no other needs at this time. Mikki Ziff, RN  

## 2018-07-26 NOTE — Evaluation (Signed)
Occupational Therapy Evaluation Patient Details Name: Michelle Castaneda MRN: [id number] DOB: [birth year] Today's Date: 07/26/2018    History of Present Illness 68 yo female s/p L4-5 decompression and fusion. PMH including HTN, RA, heart murmur, GERD, and DDD.   Clinical Impression   PTA, pt was living with her husband and was independent. Currently, pt requires Supervision for ADLs and functional mobility. Provided education and handout on back precautions, bed mobility, sleep positioning, brace management, LB ADLs, toilet transfer, and shower transfer with shower seat; pt demonstrated understanding. Answered all pt questions. Recommend dc home once medically stable per physician. All acute OT needs met and will sign off. Thank you.     Follow Up Recommendations  No OT follow up;Supervision/Assistance - 24 hour    Equipment Recommendations  None recommended by OT    Recommendations for Other Services       Precautions / Restrictions Precautions Precautions: Back Precaution Booklet Issued: Yes (comment) Precaution Comments: Provided education on back precautions and compensatory techniques for ADLs Required Braces or Orthoses: Spinal Brace Spinal Brace: Lumbar corset;Applied in sitting position      Mobility Bed Mobility Overal bed mobility: Needs Assistance Bed Mobility: Rolling;Sidelying to Sit Rolling: Supervision Sidelying to sit: Supervision       General bed mobility comments: cues for log roll technique and requiring tactile cues for bring hips over further  Transfers Overall transfer level: Needs assistance Equipment used: None Transfers: Sit to/from Stand Sit to Stand: Supervision         General transfer comment: Supervision for safety    Balance Overall balance assessment: No apparent balance deficits (not formally assessed)                                         ADL either performed or assessed with clinical judgement   ADL Overall  ADL's : Needs assistance/impaired                                       General ADL Comments: Pt performing ADLs and functional mobility at supervision level. Providing education and handout on back precautions, bed mobility, brace management, LB ADLs, and fucntional transfers     Vision Baseline Vision/History: Wears glasses Patient Visual Report: No change from baseline       Perception     Praxis      Pertinent Vitals/Pain Pain Assessment: Faces Faces Pain Scale: Hurts a little bit Pain Location: Back Pain Descriptors / Indicators: Constant;Discomfort Pain Intervention(s): Monitored during session;Repositioned     Hand Dominance     Extremity/Trunk Assessment Upper Extremity Assessment Upper Extremity Assessment: Overall WFL for tasks assessed   Lower Extremity Assessment Lower Extremity Assessment: Defer to PT evaluation   Cervical / Trunk Assessment Cervical / Trunk Assessment: Other exceptions Cervical / Trunk Exceptions: s/p back sx   Communication Communication Communication: No difficulties   Cognition Arousal/Alertness: Awake/alert Behavior During Therapy: WFL for tasks assessed/performed Overall Cognitive Status: Within Functional Limits for tasks assessed                                     General Comments       Exercises     Shoulder Instructions  Home Living Family/patient expects to be discharged to:: Private residence Living Arrangements: Spouse/significant other Available Help at Discharge: Family;Available 24 hours/day Type of Home: House Home Access: Stairs to enter Entrance Stairs-Number of Steps: 3   Home Layout: One level     Bathroom Shower/Tub: Walk-in shower   Bathroom Toilet: Standard     Home Equipment: Shower seat          Prior Functioning/Environment Level of Independence: Independent        Comments: ADL and IADLs        OT Problem List: Decreased activity  tolerance;Decreased knowledge of use of DME or AE;Decreased knowledge of precautions;Pain      OT Treatment/Interventions:      OT Goals(Current goals can be found in the care plan section) Acute Rehab OT Goals Patient Stated Goal: Go home OT Goal Formulation: All assessment and education complete, DC therapy  OT Frequency:     Barriers to D/C:            Co-evaluation              AM-PAC OT "6 Clicks" Daily Activity     Outcome Measure Help from another person eating meals?: None Help from another person taking care of personal grooming?: None Help from another person toileting, which includes using toliet, bedpan, or urinal?: None Help from another person bathing (including washing, rinsing, drying)?: None Help from another person to put on and taking off regular upper body clothing?: None Help from another person to put on and taking off regular lower body clothing?: None 6 Click Score: 24   End of Session Equipment Utilized During Treatment: Back brace Nurse Communication: Mobility status;Precautions  Activity Tolerance: Patient tolerated treatment well Patient left: (in hallway with PT)  OT Visit Diagnosis: Unsteadiness on feet (R26.81);Muscle weakness (generalized) (M62.81);Pain Pain - part of body: (Back)                Time: 0813-0829 OT Time Calculation (min): 16 min Charges:  OT General Charges $OT Visit: 1 Visit OT Evaluation $OT Eval Low Complexity: 1 Low    MSOT, OTR/L Acute Rehab Pager: [phone] Office: [phone]   M  07/26/2018, 9:18 AM 

## 2018-07-26 NOTE — Discharge Summary (Signed)
Physician Discharge Summary  Patient ID: Michelle Castaneda MRN: [id number] DOB/AGE: [birth year] 68 y.o.  Admit date: 07/25/2018 Discharge date: 07/26/2018  Admission Diagnoses:  Discharge Diagnoses:  Active Problems:   Degenerative spondylolisthesis   Discharged Condition: good  Hospital Course: Patient admitted to the hospital where she underwent uncomplicated lumbar decompression and fusion surgery.  Postop Truman Hayward doing much better.  Preoperative back and lower extremity pain much improved.  Standing and walking without difficulty.  Voiding well.  Ready for discharge home.  Consults:   Significant Diagnostic Studies:   Treatments:   Discharge Exam: Blood pressure 116/73, pulse 73, temperature 97.8 F (36.6 C), temperature source Oral, resp. rate 18, height 5\' 6"  (1.676 m), weight 84.4 kg, SpO2 100 %. Awake and alert.  Oriented and appropriate.  Motor and sensory function intact.  Wound clean and dry.  Chest and abdomen benign.    Disposition: Discharge disposition: 01-Home or Self Care        Allergies as of 07/26/2018      Reactions   Aspirin Other (See Comments)   Feels like she is foaming at the mouth.   Iodine Itching, Swelling   To shellfish   Naprosyn [naproxen] Other (See Comments)   Feels like she is foaming at the mouth   Tramadol Other (See Comments)   Nausea and dizziness      Medication List    TAKE these medications   amLODipine 2.5 MG tablet Commonly known as: NORVASC Take 2.5 mg by mouth daily.   cyclobenzaprine 10 MG tablet Commonly known as: FLEXERIL Take 1 tablet (10 mg total) by mouth 3 (three) times daily as needed for muscle spasms.   hydroxychloroquine 200 MG tablet Commonly known as: PLAQUENIL Take 200 mg by mouth 2 (two) times daily.   ibuprofen 800 MG tablet Commonly known as: ADVIL Take 1 tablet (800 mg total) by mouth every 8 (eight) hours as needed for mild pain or moderate pain.   leflunomide 20 MG tablet Commonly known as:  ARAVA Take 20 mg by mouth daily.   multivitamin with minerals Tabs tablet Take 1 tablet by mouth every other day.   omeprazole 20 MG capsule Commonly known as: PRILOSEC Take 1 capsule (20 mg total) by mouth daily. What changed:   when to take this  reasons to take this   Oxycodone HCl 10 MG Tabs Take 0.5-1 tablets (5-10 mg total) by mouth every 3 (three) hours as needed for severe pain ((score 7 to 10)).   rosuvastatin 20 MG tablet Commonly known as: CRESTOR TAKE 1 TABLET (20 MG TOTAL) BY MOUTH DAILY. NEED OV. LAST ATTEMPT.   SYSTANE OP Place 1 drop into both eyes daily as needed (itching).   VISINE OP Place 1 drop into both eyes daily as needed (itching eyes).            Durable Medical Equipment  (From admission, onward)         Start     Ordered   07/25/18 1703  DME Walker rolling  Once    Question:  Patient needs a walker to treat with the following condition  Answer:  Degenerative spondylolisthesis   07/25/18 1702   07/25/18 1703  DME 3 n 1  Once     07/25/18 1702           Signed: Cooper Render Sacora Hawbaker 07/26/2018, 1:43 PM

## 2018-07-26 NOTE — Evaluation (Signed)
Physical Therapy Evaluation and DISCHARGE Patient Details Name: KYAN GIANNONE MRN: [id number] DOB: Dec 31, 1950 Today's Date: 07/26/2018   History of Present Illness  68 yo female s/p L4-5 decompression and fusion. PMH including HTN, RA, heart murmur, GERD, and DDD.  Clinical Impression  Pt functioning at supervision level with good recall and adherence to back precautions. Pt with good home set up and support. Pt with no further acute PT needs. PT SIGNING OFF. Please re-consult if needed in future.    Follow Up Recommendations No PT follow up;Supervision - Intermittent    Equipment Recommendations  None recommended by PT    Recommendations for Other Services       Precautions / Restrictions Precautions Precautions: Back Precaution Booklet Issued: Yes (comment) Precaution Comments: Provided education on back precautions and compensatory techniques for ADLs Required Braces or Orthoses: Spinal Brace Spinal Brace: Lumbar corset;Applied in sitting position Restrictions Weight Bearing Restrictions: No      Mobility  Bed Mobility Overal bed mobility: Needs Assistance Bed Mobility: Rolling;Sidelying to Sit Rolling: Supervision Sidelying to sit: Supervision       General bed mobility comments: pt received walking in hallways, pt able to instruct me on proper log roll way to get in/out bed  Transfers Overall transfer level: Needs assistance Equipment used: None Transfers: Sit to/from Stand Sit to Stand: Supervision         General transfer comment: Supervision for safety  Ambulation/Gait Ambulation/Gait assistance: Supervision Gait Distance (Feet): 200 Feet Assistive device: None Gait Pattern/deviations: Step-through pattern Gait velocity: dec as expected Gait velocity interpretation: 1.31 - 2.62 ft/sec, indicative of limited community ambulator General Gait Details: educated on isometrically contracting abdominal muscles to support back and maintain upright  position, minimizing trunk flexion  Stairs Stairs: Yes Stairs assistance: Min guard Stair Management: One rail Left;Step to pattern;Sideways Number of Stairs: 3(to mimic home set up) General stair comments: pt with good technique  Wheelchair Mobility    Modified Rankin (Stroke Patients Only)       Balance Overall balance assessment: No apparent balance deficits (not formally assessed)                                           Pertinent Vitals/Pain Pain Assessment: 0-10 Pain Score: 5  Faces Pain Scale: Hurts a little bit Pain Location: Back Pain Descriptors / Indicators: Constant;Discomfort Pain Intervention(s): Monitored during session    Home Living Family/patient expects to be discharged to:: Private residence Living Arrangements: Spouse/significant other Available Help at Discharge: Family;Available 24 hours/day Type of Home: House Home Access: Stairs to enter   [employer] of Steps: 3 Home Layout: One level Home Equipment: Shower seat      Prior Function Level of Independence: Independent         Comments: ADL and IADLs     Hand Dominance        Extremity/Trunk Assessment   Upper Extremity Assessment Upper Extremity Assessment: Overall WFL for tasks assessed    Lower Extremity Assessment Lower Extremity Assessment: Overall WFL for tasks assessed    Cervical / Trunk Assessment Cervical / Trunk Assessment: Other exceptions Cervical / Trunk Exceptions: s/p back sx  Communication   Communication: No difficulties  Cognition Arousal/Alertness: Awake/alert Behavior During Therapy: WFL for tasks assessed/performed Overall Cognitive Status: Within Functional Limits for tasks assessed  General Comments General comments (skin integrity, edema, etc.): vss    Exercises     Assessment/Plan    PT Assessment Patent does not need any further PT services  PT Problem  List         PT Treatment Interventions      PT Goals (Current goals can be found in the Care Plan section)  Acute Rehab PT Goals Patient Stated Goal: Go home PT Goal Formulation: All assessment and education complete, DC therapy    Frequency     Barriers to discharge        Co-evaluation               AM-PAC PT "6 Clicks" Mobility  Outcome Measure Help needed turning from your back to your side while in a flat bed without using bedrails?: None Help needed moving from lying on your back to sitting on the side of a flat bed without using bedrails?: None Help needed moving to and from a bed to a chair (including a wheelchair)?: None Help needed standing up from a chair using your arms (e.g., wheelchair or bedside chair)?: None Help needed to walk in hospital room?: None Help needed climbing 3-5 steps with a railing? : A Little 6 Click Score: 23    End of Session Equipment Utilized During Treatment: Back brace Activity Tolerance: Patient tolerated treatment well Patient left: in chair;with call bell/phone within reach Nurse Communication: Mobility status PT Visit Diagnosis: Difficulty in walking, not elsewhere classified (R26.2)    Time: 0350-0938 PT Time Calculation (min) (ACUTE ONLY): 15 min   Charges:   PT Evaluation $PT Eval Low Complexity: 1 Low          Kittie Plater, PT, DPT Acute Rehabilitation Services Pager #: [phone] Office #: [phone]   Koleen Distance Vernee Baines 07/26/2018, 9:30 AM

## 2018-07-26 NOTE — Progress Notes (Signed)
Orthopedic Tech Progress Note Patient Details:  Michelle Castaneda 1950/09/28 [id number] RN said patient has brace Patient ID: Sonia Baller, female   DOB: Jun 01, 1950, 68 y.o.   MRN: [id number]   Janit Pagan 07/26/2018, 7:34 AM

## 2018-07-26 NOTE — Discharge Instructions (Signed)

## 2018-07-28 MED FILL — Heparin Sodium (Porcine) Inj 1000 Unit/ML: INTRAMUSCULAR | Qty: 30 | Status: AC

## 2018-07-28 MED FILL — Sodium Chloride IV Soln 0.9%: INTRAVENOUS | Qty: 1000 | Status: AC

## 2018-08-29 ENCOUNTER — Other Ambulatory Visit: Payer: Self-pay | Admitting: Family Medicine

## 2018-08-29 DIAGNOSIS — Z1231 Encounter for screening mammogram for malignant neoplasm of breast: Secondary | ICD-10-CM

## 2018-09-16 ENCOUNTER — Other Ambulatory Visit: Payer: Self-pay

## 2018-09-16 DIAGNOSIS — Z20822 Contact with and (suspected) exposure to covid-19: Secondary | ICD-10-CM

## 2018-09-17 LAB — NOVEL CORONAVIRUS, NAA: SARS-CoV-2, NAA: NOT DETECTED

## 2018-09-24 ENCOUNTER — Other Ambulatory Visit: Payer: Self-pay | Admitting: Cardiovascular Disease

## 2018-10-17 ENCOUNTER — Other Ambulatory Visit: Payer: Self-pay | Admitting: *Deleted

## 2018-10-17 MED ORDER — ROSUVASTATIN CALCIUM 20 MG PO TABS: 20.0000 mg | ORAL_TABLET | Freq: Every day | ORAL | 0 refills | Status: DC

## 2018-10-17 NOTE — Telephone Encounter (Signed)
Rx has been sent to the pharmacy electronically. ° °

## 2018-10-25 ENCOUNTER — Ambulatory Visit
Admission: RE | Admit: 2018-10-25 | Discharge: 2018-10-25 | Disposition: A | Discharge status: Home / Self Care | Payer: Medicare Other | Source: Ambulatory Visit | Attending: Family Medicine | Admitting: Family Medicine

## 2018-10-25 ENCOUNTER — Other Ambulatory Visit: Payer: Self-pay

## 2018-10-25 DIAGNOSIS — Z1231 Encounter for screening mammogram for malignant neoplasm of breast: Secondary | ICD-10-CM

## 2019-01-10 ENCOUNTER — Other Ambulatory Visit: Payer: Self-pay | Admitting: Cardiovascular Disease

## 2019-01-12 ENCOUNTER — Other Ambulatory Visit: Payer: Self-pay | Admitting: Cardiovascular Disease

## 2019-01-12 MED ORDER — ROSUVASTATIN CALCIUM 20 MG PO TABS: 20.0000 mg | ORAL_TABLET | Freq: Every day | ORAL | 0 refills | Status: DC

## 2019-01-12 NOTE — Telephone Encounter (Signed)
°*  STAT* If patient is at the pharmacy, call can be transferred to refill team.   1. Which medications need to be refilled? (please list name of each medication and dose if known) rosuvastatin (CRESTOR) 20 MG tablet  2. Which pharmacy/location (including street and city if local pharmacy) is medication to be sent to? [id number] - [place], [place]  3. Do they need a 30 day or 90 day supply? 90 day   Patient has appointment scheduled regarding refill on 01/17/19 at 1:20 PM.

## 2019-01-17 ENCOUNTER — Ambulatory Visit: Payer: Medicare Other | Admitting: Cardiovascular Disease

## 2019-01-17 ENCOUNTER — Encounter (INDEPENDENT_AMBULATORY_CARE_PROVIDER_SITE_OTHER): Payer: Self-pay

## 2019-01-17 ENCOUNTER — Other Ambulatory Visit: Payer: Self-pay

## 2019-01-17 ENCOUNTER — Encounter: Payer: Self-pay | Admitting: Cardiovascular Disease

## 2019-01-17 VITALS — BP 152/85 | HR 63 | Temp 97.2°F | Ht 67.0 in | Wt 180.0 lb

## 2019-01-17 DIAGNOSIS — I10 Essential (primary) hypertension: Secondary | ICD-10-CM | POA: Diagnosis not present

## 2019-01-17 DIAGNOSIS — M069 Rheumatoid arthritis, unspecified: Secondary | ICD-10-CM

## 2019-01-17 DIAGNOSIS — E785 Hyperlipidemia, unspecified: Secondary | ICD-10-CM | POA: Diagnosis not present

## 2019-01-17 DIAGNOSIS — I351 Nonrheumatic aortic (valve) insufficiency: Secondary | ICD-10-CM | POA: Diagnosis not present

## 2019-01-17 DIAGNOSIS — K219 Gastro-esophageal reflux disease without esophagitis: Secondary | ICD-10-CM

## 2019-01-17 MED ORDER — AMLODIPINE BESYLATE 5 MG PO TABS: 7.5000 mg | ORAL_TABLET | Freq: Every day | ORAL | 3 refills | Status: DC

## 2019-01-17 NOTE — Patient Instructions (Signed)
Medication Instructions:  Your physician has recommended you make the following change in your medication:   INCREASE YOUR AMLODIPINE TO 7.5 MG BY MOUTH DAILY  *If you need a refill on your cardiac medications before your next appointment, please call your pharmacy*  Lab Work: NONE If you have labs (blood work) drawn today and your tests are completely normal, you will receive your results only by: Marland Kitchen MyChart Message (if you have MyChart) OR . A paper copy in the mail If you have any lab test that is abnormal or we need to change your treatment, we will call you to review the results.  Testing/Procedures: NONE  Follow-Up: At [facility], you and your health needs are our priority.  As part of our continuing mission to provide you with exceptional heart care, we have created designated Provider Care Teams.  These Care Teams include your primary Cardiologist (physician) and Advanced Practice Providers (APPs -  Physician Assistants and Nurse Practitioners) who all work together to provide you with the care you need, when you need it.  Your next appointment:   12 month(s)  The format for your next appointment:   Either In Person or Virtual  Provider:   Shelva Majestic, MD

## 2019-01-17 NOTE — Progress Notes (Signed)
Cardiology Office Note    Date:  01/17/2019   ID:  Michelle Castaneda, DOB [birth year], MRN [id number]  PCP:  Michelle Cruel, MD  Cardiologist:  Shelva Majestic, MD    History of Present Illness:  Michelle Castaneda is a 68 y.o. female who who was  referred through the courtesy of Dr. Tamsen Meek for further evaluation of cardiac murmur and cardiology evaluation.  I saw her in May 2018.  She presents for follow-up evaluation  Ms. Michelle Castaneda has a history of rheumatoid arthritis, and is status post a parathyroidectomy and thyroid nodule removal.  She has been found to have a cardiac murmur and admits of being told of having this for a long time.  When I saw her for her initial visit, she did not recall having a previous cardiac echo.  At the time she denied any episodes of chest pain, presyncope or syncope, and awareness of change in ability to walk, PND or orthopnea.  She was exercising regularly and was in a exercise class 4 days per week for 30 minutes and on 2 other days typically walks so that she is exercising at least 6 days per week.  I have reviewed records from [place] and in the note of Dr. Harrington Challenger.  There is mention that the patient has a bicuspid aortic valve.  The patient is unaware of when this diagnosis was made. Review of laboratory indicates that she has significant hyperlipidemia which is untreated.  In February 2017.  Total cholesterol was 325 with an LDL of 210 and a non-HDL of 220.  HDL was 105.  In May 2018.  Total cholesterol was 300 with an LDL of 186, and HDL remained high at 102 with normal triglycerides at 60.    When I initially saw her, I recommended she undergo a 2D echo Doppler study which was done on July 14, 2016.  This revealed normal systolic function with a EF of 60 to 65%.  There was grade 1 diastolic dysfunction.  Aortic valve appeared structurally normal.  There was no evidence for stenosis.  There was mild aortic insufficiency.  There was trivial  tricuspid regurgitation.  She was subsequently seen by Bernerd Pho, [place] in August 2018.  Since I last saw her, she has been followed by Dr. Harrington Challenger.  She had been on amlodipine 2.5 mg for hypertension but approximately 6 weeks ago due to elevated blood pressure this dose was increased by Dr. Harrington Challenger to 5 mg daily.  She is on rosuvastatin 20 mg for hyperlipidemia.  Recent laboratory on December 16, 2018 showed total cholesterol 190, HDL 91, LDL 90, and triglycerides 45.  She has rheumatoid arthritis and is on Hydrea chloroquine in addition to leflunomide.  She has GERD on omeprazole.  She has a prescription for ibuprofen but rarely takes this.  She states that since she has been on the increased amlodipine her blood pressure has been running in the mid 330Q systolically.  She denies any leg swelling.  She denies any chest pain.  She has not been exercising as she had in the past during this COVID-19 pandemic.  She also had undergone low back surgery several months ago at L3-L4 by Dr. Deri Fuelling.  She presents for a 2-1/2-year follow-up evaluation.   Past Medical History:  Diagnosis Date  . DDD (degenerative disc disease), lumbar   . GERD (gastroesophageal reflux disease)   . Heart murmur    a. 06/2016: echo showing EF of 60-65%  with Grade 1 DD and mild AI.   Marland Kitchen Hyperlipidemia   . Hypertension   . Pneumonia   . RA (rheumatoid arthritis) ([place])   . Spondylolisthesis of lumbar region   . Wears dentures   . Wears glasses     Past Surgical History:  Procedure Laterality Date  . BREAST SURGERY    . CERVICAL POLYPECTOMY N/A 05/30/2015   Procedure: POLYPECTOMY;  Surgeon: Christophe Louis, MD;  Location: [facility];  Service: Gynecology;  Laterality: N/A;  . COLONOSCOPY    . DIAGNOSTIC LAPAROSCOPY    . DILATATION & CURETTAGE/HYSTEROSCOPY WITH MYOSURE N/A 05/30/2015   Procedure: DILATATION & CURETTAGE/HYSTEROSCOPY WITH MYOSURE;  Surgeon: Christophe Louis, MD;  Location: [facility];  Service: Gynecology;  Laterality: N/A;  .  LAPAROSCOPY FOR ECTOPIC PREGNANCY    . MULTIPLE TOOTH EXTRACTIONS    . THYROID SURGERY    . TUBAL LIGATION      Current Medications: Outpatient Medications Prior to Visit  Medication Sig Dispense Refill  . hydroxychloroquine (PLAQUENIL) 200 MG tablet Take 200 mg by mouth 2 (two) times daily.    Marland Kitchen ibuprofen (ADVIL,MOTRIN) 800 MG tablet Take 1 tablet (800 mg total) by mouth every 8 (eight) hours as needed for mild pain or moderate pain. 30 tablet 1  . leflunomide (ARAVA) 20 MG tablet Take 20 mg by mouth daily.    . Multiple Vitamin (MULTIVITAMIN WITH MINERALS) TABS tablet Take 1 tablet by mouth every other day.    Marland Kitchen omeprazole (PRILOSEC) 20 MG capsule Take 1 capsule (20 mg total) by mouth daily. (Patient taking differently: Take 20 mg by mouth daily as needed (acid reflux). ) 30 capsule 0  . Polyethyl Glycol-Propyl Glycol (SYSTANE OP) Place 1 drop into both eyes daily as needed (itching).    . rosuvastatin (CRESTOR) 20 MG tablet Take 1 tablet (20 mg total) by mouth daily. NEED OV. LAST ATTEMPT. 90 tablet 0  . Tetrahydrozoline HCl (VISINE OP) Place 1 drop into both eyes daily as needed (itching eyes).    Marland Kitchen amLODipine (NORVASC) 2.5 MG tablet Take 5 mg by mouth daily.     Marland Kitchen amLODipine (NORVASC) 5 MG tablet Take 5 mg by mouth daily.    . cyclobenzaprine (FLEXERIL) 10 MG tablet Take 1 tablet (10 mg total) by mouth 3 (three) times daily as needed for muscle spasms. 30 tablet 0  . oxyCODONE 10 MG TABS Take 0.5-1 tablets (5-10 mg total) by mouth every 3 (three) hours as needed for severe pain ((score 7 to 10)). 40 tablet 0   No facility-administered medications prior to visit.     Allergies:   Aspirin, Iodine, Naprosyn [naproxen], and Tramadol   Social History   Socioeconomic History  . Marital status: Married    Spouse name: Not on file  . Number of children: Not on file  . Years of education: Not on file  . Highest education level: Not on file  Occupational History  . Not on file   Tobacco Use  . Smoking status: Former Smoker    Types: Cigarettes  . Smokeless tobacco: Never Used  Substance and Sexual Activity  . Alcohol use: Yes    Comment: occ  . Drug use: No  . Sexual activity: Not on file  Other Topics Concern  . Not on file  Social History Narrative  . Not on file   Social Determinants of Health   Financial Resource Strain:   . Difficulty of Paying Living Expenses: Not on file  Food Insecurity:   .  Worried About Charity fundraiser in the Last Year: Not on file  . Ran Out of Food in the Last Year: Not on file  Transportation Needs:   . Lack of Transportation (Medical): Not on file  . Lack of Transportation (Non-Medical): Not on file  Physical Activity:   . Days of Exercise per Week: Not on file  . Minutes of Exercise per Session: Not on file  Stress:   . Feeling of Stress : Not on file  Social Connections:   . Frequency of Communication with Friends and Family: Not on file  . Frequency of Social Gatherings with Friends and Family: Not on file  . Attends Religious Services: Not on file  . Active Member of Clubs or Organizations: Not on file  . Attends Archivist Meetings: Not on file  . Marital Status: Not on file    Additional social history is that she is married for 49 years.  She has 3 sons, 5 grandchildren, and 2 great-grandchildren.  I take care of he patient's husband who recently had valve surgery  Family History:  The patient's family history includes Alcoholism in her father; Diabetes in her brother, father, and paternal grandmother; Diabetes Mellitus II in her mother; Heart disease in her brother; Lung cancer in her maternal grandfather.   Her mother died at age 38 with heart failure.  She has 4 deceased brothers and 3 brothers are alive.  ROS General: Negative; No fevers, chills, or night sweats;  HEENT: Negative; No changes in vision or hearing, sinus congestion, difficulty swallowing Pulmonary: Negative; No cough,  wheezing, shortness of breath, hemoptysis Cardiovascular: See history of present illness GI: Negative; No nausea, vomiting, diarrhea, or abdominal pain GU: Negative; No dysuria, hematuria, or difficulty voiding Musculoskeletal: Status post recent L3-L4 back surgery Hematologic/Oncology: Negative; no easy bruising, bleeding Endocrine: Negative; no heat/cold intolerance; no diabetes Neuro: Negative; no changes in balance, headaches Skin: Negative; No rashes or skin lesions Psychiatric: Negative; No behavioral problems, depression Sleep: Negative; No snoring, daytime sleepiness, hypersomnolence, bruxism, restless legs, hypnogognic hallucinations, no cataplexy Other comprehensive 14 point system review is negative.   PHYSICAL EXAM:   VS:  BP (!) 152/85   Pulse 63   Temp (!) 97.2 F (36.2 C)   Ht _0  (1.702 m)   Wt 180 lb (81.6 kg)   SpO2 98%   BMI 28.19 kg/m     Repeat blood pressure by me was 142/80 initially and repeat was 140/78  Wt Readings from Last 3 Encounters:  01/17/19 180 lb (81.6 kg)  07/25/18 186 lb (84.4 kg)  07/21/18 186 lb (84.4 kg)    General: Alert, oriented, no distress.  Skin: normal turgor, no rashes, warm and dry HEENT: Normocephalic, atraumatic. Pupils equal round and reactive to light; sclera anicteric; extraocular muscles intact;  Nose without nasal septal hypertrophy Mouth/Parynx benign; Mallinpatti scale 3 Neck: No JVD, no carotid bruits; normal carotid upstroke Lungs: clear to ausculatation and percussion; no wheezing or rales Chest wall: without tenderness to palpitation Heart: PMI not displaced, RRR, s1 s2 normal, 0-1/7 systolic murmur, no diastolic murmur, no rubs, gallops, thrills, or heaves Abdomen: soft, nontender; no hepatosplenomehaly, BS+; abdominal aorta nontender and not dilated by palpation. Back: no CVA tenderness Pulses 2+ Musculoskeletal: full range of motion, normal strength, no joint deformities Extremities: no clubbing cyanosis  or edema, Homan's sign negative  Neurologic: grossly nonfocal; Cranial nerves grossly wnl Psychologic: Normal mood and affect   Studies/Labs Reviewed:   EKG:  EKG is ordered today. ECG (independently read by me): Normal sinus rhythm at 63 bpm, LVH by voltage.  May 2018 ECG (independently read by me): normal sinus rhythm at 78 bpm.  Mild LVH by voltage criteria in aVL.  Normal intervals.  Recent Labs: BMP Latest Ref Rng & Units 07/21/2018 09/14/2016 07/03/2016  Glucose 70 - 99 mg/dL 105(H) 118(H) 130(H)  BUN 8 - 23 mg/dL _0 Creatinine 0.44 - 1.00 mg/dL 0.60 0.74 0.84  BUN/Creat Ratio 12 - 28 - 27 -  Sodium 135 - 145 mmol/L 140 142 139  Potassium 3.5 - 5.1 mmol/L 4.0 5.2 3.9  Chloride 98 - 111 mmol/L 108 106 106  CO2 22 - 32 mmol/L _1 Calcium 8.9 - 10.3 mg/dL 9.9 10.4(H) 10.0     Hepatic Function Latest Ref Rng & Units 09/14/2016  Total Protein 6.0 - 8.5 g/dL 6.7  Albumin 3.6 - 4.8 g/dL 4.4  AST 0 - 40 IU/L 28  ALT 0 - 32 IU/L 30  Alk Phosphatase 39 - 117 IU/L 105  Total Bilirubin 0.0 - 1.2 mg/dL 0.3    CBC Latest Ref Rng & Units 07/21/2018 07/03/2016 05/09/2015  WBC 4.0 - 10.5 K/uL 3.2(L) 4.7 3.6(L)  Hemoglobin 12.0 - 15.0 g/dL 13.2 12.6 13.7  Hematocrit 36.0 - 46.0 % 42.5 38.6 41.6  Platelets 150 - 400 K/uL 140(L) 158 171   Lab Results  Component Value Date   MCV 93.4 07/21/2018   MCV 88.9 07/03/2016   MCV 88.9 05/09/2015   No results found for: TSH No results found for: HGBA1C   BNP No results found for: BNP  ProBNP No results found for: PROBNP   Lipid Panel     Component Value Date/Time   CHOL 188 09/14/2016 0918   TRIG 55 09/14/2016 0918   HDL 100 09/14/2016 0918   CHOLHDL 1.9 09/14/2016 0918   LDLCALC 77 09/14/2016 0918     RADIOLOGY: No results found.   Additional studies/ records that were reviewed today include:  I reviewed the patient's records from [place]  Echo Doppler from June 2018 was reviewed    ASSESSMENT:     1. Essential hypertension   2. Nonrheumatic aortic valve insufficiency   3. Hyperlipidemia LDL goal <130   4. Rheumatoid arthritis, involving unspecified site, unspecified whether rheumatoid factor present ([place])   5. Gastroesophageal reflux disease without esophagitis     PLAN:  Ms. Shirell Struthers is a very pleasant 68 year old female who admits to a long-standing history of a cardiac murmur.  In the [facility] records, there was mention of a possible bicuspid aortic valve.  I reviewed her 2D echo Doppler study from June 2018 which showed a structurally normal aortic valve.  There was no mention of a bicuspid valve.  She did have mild aortic insufficiency.  There was no evidence for aortic valve stenosis or significant sclerosis.  She also had trace MR.  LV function was normal with EF estimated 60 to 65% with grade 1 diastolic dysfunction.  Recently, her low-dose amlodipine was increased to 5 mg by Dr. Harrington Challenger approximately 6 weeks ago.  Her blood pressure today continues to be slightly elevated particularly based on new hypertensive guidelines.  Her resting pulse is in the low 60s.  She does not have any edema.  I am recommending slight additional titration of amlodipine to 7.5 mg daily daily.  Optimal blood pressure is less than 120/80 if at all possible.  Lipid studies are  significantly improved with rosuvastatin with LDL cholesterol now at 90.  She underwent L3-L4 back surgery without cardiovascular compromise.  Her ECG suggests borderline LVH by voltage in aVL.  She continues to be on home omeprazole for GERD.  Her rheumatoid arthritis is fairly stable on hydrochloric when in addition to her leflunomide.  I advised her against taking ibuprofen if at all possible since this may contribute to mild blood pressure elevation.  We discussed resuming activity level particularly exercising at least 5 days/week for at least 30 minutes of moderate intensity if at all possible.  She will monitor her blood pressure  return to Dr. Harrington Challenger.  I will see her in 1 year for cardiology reevaluation.   Medication Adjustments/Labs and Tests Ordered: Current medicines are reviewed at length with the patient today.  Concerns regarding medicines are outlined above.  Medication changes, Labs and Tests ordered today are listed in the Patient Instructions below. Patient Instructions  Medication Instructions:  Your physician has recommended you make the following change in your medication:   INCREASE YOUR AMLODIPINE TO 7.5 MG BY MOUTH DAILY  *If you need a refill on your cardiac medications before your next appointment, please call your pharmacy*  Lab Work: NONE If you have labs (blood work) drawn today and your tests are completely normal, you will receive your results only by: Marland Kitchen MyChart Message (if you have MyChart) OR . A paper copy in the mail If you have any lab test that is abnormal or we need to change your treatment, we will call you to review the results.  Testing/Procedures: NONE  Follow-Up: At [facility], you and your health needs are our priority.  As part of our continuing mission to provide you with exceptional heart care, we have created designated Provider Care Teams.  These Care Teams include your primary Cardiologist (physician) and Advanced Practice Providers (APPs -  Physician Assistants and Nurse Practitioners) who all work together to provide you with the care you need, when you need it.  Your next appointment:   12 month(s)  The format for your next appointment:   Either In Person or Virtual  Provider:   Shelva Majestic, MD      Signed, Shelva Majestic, MD  01/17/2019 2:07 PM    [facility] [address], [place], [place], [place]  [address] Phone: [phone]

## 2019-04-08 ENCOUNTER — Other Ambulatory Visit: Payer: Self-pay | Admitting: Cardiovascular Disease

## 2019-07-09 ENCOUNTER — Other Ambulatory Visit: Payer: Self-pay | Admitting: Cardiovascular Disease

## 2019-11-27 ENCOUNTER — Other Ambulatory Visit: Payer: Self-pay | Admitting: Rheumatology

## 2019-11-27 DIAGNOSIS — N951 Menopausal and female climacteric states: Secondary | ICD-10-CM

## 2019-12-08 ENCOUNTER — Other Ambulatory Visit: Payer: Self-pay | Admitting: Family Medicine

## 2019-12-08 DIAGNOSIS — Z1231 Encounter for screening mammogram for malignant neoplasm of breast: Secondary | ICD-10-CM

## 2019-12-08 DIAGNOSIS — E2839 Other primary ovarian failure: Secondary | ICD-10-CM

## 2019-12-11 ENCOUNTER — Ambulatory Visit
Admission: RE | Admit: 2019-12-11 | Discharge: 2019-12-11 | Disposition: A | Discharge status: Home / Self Care | Payer: Medicare PPO | Source: Ambulatory Visit | Attending: Family Medicine | Admitting: Family Medicine

## 2019-12-11 ENCOUNTER — Other Ambulatory Visit: Payer: Self-pay

## 2019-12-11 DIAGNOSIS — Z1231 Encounter for screening mammogram for malignant neoplasm of breast: Secondary | ICD-10-CM

## 2020-01-12 ENCOUNTER — Other Ambulatory Visit: Payer: Self-pay | Admitting: Cardiovascular Disease

## 2020-02-16 DIAGNOSIS — Z01812 Encounter for preprocedural laboratory examination: Secondary | ICD-10-CM | POA: Diagnosis not present

## 2020-02-21 DIAGNOSIS — K635 Polyp of colon: Secondary | ICD-10-CM | POA: Diagnosis not present

## 2020-02-21 DIAGNOSIS — Z86010 Personal history of colonic polyps: Secondary | ICD-10-CM | POA: Diagnosis not present

## 2020-02-21 DIAGNOSIS — K640 First degree hemorrhoids: Secondary | ICD-10-CM | POA: Diagnosis not present

## 2020-02-21 DIAGNOSIS — K5730 Diverticulosis of large intestine without perforation or abscess without bleeding: Secondary | ICD-10-CM | POA: Diagnosis not present

## 2020-02-23 DIAGNOSIS — K635 Polyp of colon: Secondary | ICD-10-CM | POA: Diagnosis not present

## 2020-02-28 DIAGNOSIS — Z03818 Encounter for observation for suspected exposure to other biological agents ruled out: Secondary | ICD-10-CM | POA: Diagnosis not present

## 2020-02-28 DIAGNOSIS — Z20822 Contact with and (suspected) exposure to covid-19: Secondary | ICD-10-CM | POA: Diagnosis not present

## 2020-03-01 DIAGNOSIS — M5136 Other intervertebral disc degeneration, lumbar region: Secondary | ICD-10-CM | POA: Diagnosis not present

## 2020-03-01 DIAGNOSIS — Z79899 Other long term (current) drug therapy: Secondary | ICD-10-CM | POA: Diagnosis not present

## 2020-03-01 DIAGNOSIS — E663 Overweight: Secondary | ICD-10-CM | POA: Diagnosis not present

## 2020-03-01 DIAGNOSIS — Z6827 Body mass index (BMI) 27.0-27.9, adult: Secondary | ICD-10-CM | POA: Diagnosis not present

## 2020-03-01 DIAGNOSIS — M0579 Rheumatoid arthritis with rheumatoid factor of multiple sites without organ or systems involvement: Secondary | ICD-10-CM | POA: Diagnosis not present

## 2020-03-01 DIAGNOSIS — M2550 Pain in unspecified joint: Secondary | ICD-10-CM | POA: Diagnosis not present

## 2020-03-02 ENCOUNTER — Other Ambulatory Visit: Payer: Self-pay | Admitting: Cardiovascular Disease

## 2020-03-07 DIAGNOSIS — M5416 Radiculopathy, lumbar region: Secondary | ICD-10-CM | POA: Diagnosis not present

## 2020-03-20 ENCOUNTER — Ambulatory Visit
Admission: RE | Admit: 2020-03-20 | Discharge: 2020-03-20 | Disposition: A | Discharge status: Home / Self Care | Payer: Medicare PPO | Source: Ambulatory Visit | Attending: Family Medicine | Admitting: Family Medicine

## 2020-03-20 ENCOUNTER — Other Ambulatory Visit: Payer: Self-pay

## 2020-03-20 DIAGNOSIS — M85852 Other specified disorders of bone density and structure, left thigh: Secondary | ICD-10-CM | POA: Diagnosis not present

## 2020-03-20 DIAGNOSIS — E2839 Other primary ovarian failure: Secondary | ICD-10-CM

## 2020-03-20 DIAGNOSIS — Z780 Asymptomatic menopausal state: Secondary | ICD-10-CM | POA: Diagnosis not present

## 2020-03-26 DIAGNOSIS — M545 Low back pain, unspecified: Secondary | ICD-10-CM | POA: Diagnosis not present

## 2020-03-26 DIAGNOSIS — M5416 Radiculopathy, lumbar region: Secondary | ICD-10-CM | POA: Diagnosis not present

## 2020-03-29 ENCOUNTER — Other Ambulatory Visit: Payer: Self-pay | Admitting: Cardiovascular Disease

## 2020-04-03 DIAGNOSIS — M5416 Radiculopathy, lumbar region: Secondary | ICD-10-CM | POA: Diagnosis not present

## 2020-04-03 DIAGNOSIS — M4310 Spondylolisthesis, site unspecified: Secondary | ICD-10-CM | POA: Diagnosis not present

## 2020-04-06 ENCOUNTER — Other Ambulatory Visit: Payer: Self-pay | Admitting: Cardiovascular Disease

## 2020-04-18 DIAGNOSIS — M5416 Radiculopathy, lumbar region: Secondary | ICD-10-CM | POA: Diagnosis not present

## 2020-05-16 DIAGNOSIS — I10 Essential (primary) hypertension: Secondary | ICD-10-CM | POA: Diagnosis not present

## 2020-05-16 DIAGNOSIS — M4310 Spondylolisthesis, site unspecified: Secondary | ICD-10-CM | POA: Diagnosis not present

## 2020-05-16 DIAGNOSIS — Z6828 Body mass index (BMI) 28.0-28.9, adult: Secondary | ICD-10-CM | POA: Diagnosis not present

## 2020-06-07 DIAGNOSIS — M2550 Pain in unspecified joint: Secondary | ICD-10-CM | POA: Diagnosis not present

## 2020-06-07 DIAGNOSIS — E663 Overweight: Secondary | ICD-10-CM | POA: Diagnosis not present

## 2020-06-07 DIAGNOSIS — M0579 Rheumatoid arthritis with rheumatoid factor of multiple sites without organ or systems involvement: Secondary | ICD-10-CM | POA: Diagnosis not present

## 2020-06-07 DIAGNOSIS — M150 Primary generalized (osteo)arthritis: Secondary | ICD-10-CM | POA: Diagnosis not present

## 2020-06-07 DIAGNOSIS — M545 Low back pain, unspecified: Secondary | ICD-10-CM | POA: Diagnosis not present

## 2020-06-07 DIAGNOSIS — Z6827 Body mass index (BMI) 27.0-27.9, adult: Secondary | ICD-10-CM | POA: Diagnosis not present

## 2020-06-07 DIAGNOSIS — Z79899 Other long term (current) drug therapy: Secondary | ICD-10-CM | POA: Diagnosis not present

## 2020-06-08 ENCOUNTER — Other Ambulatory Visit: Payer: Self-pay | Admitting: Cardiovascular Disease

## 2020-06-11 ENCOUNTER — Other Ambulatory Visit: Payer: Self-pay

## 2020-06-11 ENCOUNTER — Ambulatory Visit: Payer: Medicare PPO | Admitting: Cardiovascular Disease

## 2020-06-11 ENCOUNTER — Encounter: Payer: Self-pay | Admitting: Cardiovascular Disease

## 2020-06-11 DIAGNOSIS — M069 Rheumatoid arthritis, unspecified: Secondary | ICD-10-CM | POA: Diagnosis not present

## 2020-06-11 DIAGNOSIS — E785 Hyperlipidemia, unspecified: Secondary | ICD-10-CM

## 2020-06-11 DIAGNOSIS — R011 Cardiac murmur, unspecified: Secondary | ICD-10-CM

## 2020-06-11 DIAGNOSIS — I10 Essential (primary) hypertension: Secondary | ICD-10-CM | POA: Diagnosis not present

## 2020-06-11 DIAGNOSIS — R9431 Abnormal electrocardiogram [ECG] [EKG]: Secondary | ICD-10-CM

## 2020-06-11 DIAGNOSIS — K219 Gastro-esophageal reflux disease without esophagitis: Secondary | ICD-10-CM | POA: Diagnosis not present

## 2020-06-11 MED ORDER — LOSARTAN POTASSIUM 50 MG PO TABS: 50.0000 mg | ORAL_TABLET | Freq: Every day | ORAL | 3 refills | Status: DC

## 2020-06-11 NOTE — Patient Instructions (Addendum)
Medication Instructions:  BEGIN Losartan 50mg  daily. Per Dr. , for the first 4 days, take a half tablet (25mg ). After the first 4 days, begin taking a whole tablet (50mg ).   *If you need a refill on your cardiac medications before your next appointment, please call your pharmacy*   Lab Work: CMet, Lipids, TSH, CBC to be drawn in 1-2 weeks.    Testing/Procedures: To be scheduled at [facility]: Your physician has requested that you have an echocardiogram. Echocardiography is a painless test that uses sound waves to create images of your heart. It provides your doctor with information about the size and shape of your heart and how well your heart's chambers and valves are working. This procedure takes approximately one hour. There are no restrictions for this procedure.   Follow-Up: At [facility], you and your health needs are our priority.  As part of our continuing mission to provide you with exceptional heart care, we have created designated Provider Care Teams.  These Care Teams include your primary Cardiologist (physician) and Advanced Practice Providers (APPs -  Physician Assistants and Nurse Practitioners) who all work together to provide you with the care you need, when you need it.  We recommend signing up for the patient portal called "MyChart".  Sign up information is provided on this After Visit Summary.  MyChart is used to connect with patients for Virtual Visits (Telemedicine).  Patients are able to view lab/test results, encounter notes, upcoming appointments, etc.  Non-urgent messages can be sent to your provider as well.   To learn more about what you can do with MyChart, go to .    Your next appointment:   3 month(s)  The format for your next appointment:   In Person  Provider:   [facility], MD

## 2020-06-11 NOTE — Progress Notes (Signed)
Cardiology Office Note    Date:  06/13/2020   ID:  Michelle Castaneda, DOB 1950/02/07, MRN [id number]  PCP:  Michelle Cruel, MD  Cardiologist:  Shelva Majestic, MD    History of Present Illness:  Michelle Castaneda is a 70 y.o. female who who was  referred through the courtesy of Dr. Tamsen Meek for further evaluation of cardiac murmur and cardiology evaluation.  I saw her in May 2018 and last saw her in December 2020.  She presents for a 17 month follow-up evaluation.  Michelle Castaneda has a history of rheumatoid arthritis, and is status post a parathyroidectomy and thyroid nodule removal.  She has been found to have a cardiac murmur and admits of being told of having this for a long time.  When I saw her for her initial visit, she did not recall having a previous cardiac echo.  At the time she denied any episodes of chest pain, presyncope or syncope, and awareness of change in ability to walk, PND or orthopnea.  She was exercising regularly and was in a exercise class 4 days per week for 30 minutes and on 2 other days typically walks so that she is exercising at least 6 days per week.  I have reviewed records from [place] and in the note of Dr. Harrington Challenger.  There is mention that the patient has a bicuspid aortic valve.  The patient is unaware of when this diagnosis was made. Review of laboratory indicates that she has significant hyperlipidemia which is untreated.  In February 2017.  Total cholesterol was 325 with an LDL of 210 and a non-HDL of 220.  HDL was 105.  In May 2018.  Total cholesterol was 300 with an LDL of 186, and HDL remained high at 102 with normal triglycerides at 60.    When I initially saw her, I recommended she undergo a 2D echo Doppler study which was done on July 14, 2016.  This revealed normal systolic function with a EF of 60 to 65%.  There was grade 1 diastolic dysfunction.  Aortic valve appeared structurally normal.  There was no evidence for stenosis.  There was  mild aortic insufficiency.  There was trivial tricuspid regurgitation.  She was subsequently seen by Bernerd Pho, [place] in August 2018.  I last saw her in December 2020.,  At that time she was on amlodipine 2.5 mg for hypertension but approximately 6 weeks ago due to elevated blood pressure this dose was increased by Dr. Melinda Crutch to 5 mg daily.  She iwason rosuvastatin 20 mg for hyperlipidemia.  Recent laboratory on December 16, 2018 showed total cholesterol 190, HDL 91, LDL 90, and triglycerides 45.  She has rheumatoid arthritis and is on Hydrea chloroquine in addition to leflunomide.  She has GERD on omeprazole.  She has a prescription for ibuprofen but rarely takes this.  She states that since she has been on the increased amlodipine her blood pressure has been running in the mid 637C systolically.  She denies any leg swelling.  She denies any chest pain.  She has not been exercising as she had in the past during this COVID-19 pandemic.  She also had undergone low back surgery several months ago at L3-L[facility] by Dr. Deri Fuelling.  During that evaluation, her blood pressure was elevated and I recommended slight titration of amlodipine to 7.5 mg daily.  Since I last saw her, she has continued to be followed by Dr. Harrington Challenger for primary care.  He continues to be on amlodipine 7.5 mg for blood pressure control.  She is on hydroxychloroquine and leflunomide for her rheumatoid arthritis.  She is on rosuvastatin 20 mg for hyperlipidemia and omeprazole for GERD.  She presents for evaluation.  Past Medical History:  Diagnosis Date  . DDD (degenerative disc disease), lumbar   . GERD (gastroesophageal reflux disease)   . Heart murmur    a. 06/2016: echo showing EF of 60-65% with Grade 1 DD and mild AI.   Marland Kitchen Hyperlipidemia   . Hypertension   . Pneumonia   . RA (rheumatoid arthritis) ([place])   . Spondylolisthesis of lumbar region   . Wears dentures   . Wears glasses     Past Surgical History:  Procedure  Laterality Date  . BREAST SURGERY    . CERVICAL POLYPECTOMY N/A 05/30/2015   Procedure: POLYPECTOMY;  Surgeon: Christophe Louis, MD;  Location: [facility];  Service: Gynecology;  Laterality: N/A;  . COLONOSCOPY    . DIAGNOSTIC LAPAROSCOPY    . DILATATION & CURETTAGE/HYSTEROSCOPY WITH MYOSURE N/A 05/30/2015   Procedure: DILATATION & CURETTAGE/HYSTEROSCOPY WITH MYOSURE;  Surgeon: Christophe Louis, MD;  Location: [facility];  Service: Gynecology;  Laterality: N/A;  . LAPAROSCOPY FOR ECTOPIC PREGNANCY    . MULTIPLE TOOTH EXTRACTIONS    . THYROID SURGERY    . TUBAL LIGATION      Current Medications: Outpatient Medications Prior to Visit  Medication Sig Dispense Refill  . amLODipine (NORVASC) 5 MG tablet Take 1.5 tablets (7.5 mg total) by mouth daily. 135 tablet 3  . cyclobenzaprine (FLEXERIL) 10 MG tablet Take by mouth.    . gabapentin (NEURONTIN) 300 MG capsule TAKE 1 CAPSULE BY MOUTH EVERYDAY AT BEDTIME    . hydroxychloroquine (PLAQUENIL) 200 MG tablet Take 200 mg by mouth 2 (two) times daily.    Marland Kitchen ibuprofen (ADVIL,MOTRIN) 800 MG tablet Take 1 tablet (800 mg total) by mouth every 8 (eight) hours as needed for mild pain or moderate pain. 30 tablet 1  . leflunomide (ARAVA) 20 MG tablet Take 20 mg by mouth daily.    . Multiple Vitamin (MULTIVITAMIN WITH MINERALS) TABS tablet Take 1 tablet by mouth every other day.    . naphazoline-pheniramine (VISINE) 0.025-0.3 % ophthalmic solution 1 drop into both eyes as needed for itching    . omeprazole (PRILOSEC) 20 MG capsule Take 1 capsule (20 mg total) by mouth daily. 30 capsule 0  . Polyethyl Glycol-Propyl Glycol (SYSTANE OP) Place 1 drop into both eyes daily as needed (itching).    . rosuvastatin (CRESTOR) 20 MG tablet TAKE 1 TABLET (20 MG TOTAL) BY MOUTH DAILY. KEEP UPCOMING APPOINTMENT 30 tablet 1  . Tetrahydrozoline HCl (VISINE OP) Place 1 drop into both eyes daily as needed (itching eyes).     No facility-administered medications prior to visit.     Allergies:    Aspirin, Iodine, Naprosyn [naproxen], and Tramadol   Social History   Socioeconomic History  . Marital status: Married    Spouse name: Not on file  . Number of children: Not on file  . Years of education: Not on file  . Highest education level: Not on file  Occupational History  . Not on file  Tobacco Use  . Smoking status: Former Smoker    Types: Cigarettes  . Smokeless tobacco: Never Used  Vaping Use  . Vaping Use: Never used  Substance and Sexual Activity  . Alcohol use: Yes    Comment: occ  . Drug use: No  .  Sexual activity: Not on file  Other Topics Concern  . Not on file  Social History Narrative  . Not on file   Social Determinants of Health   Financial Resource Strain: Not on file  Food Insecurity: Not on file  Transportation Needs: Not on file  Physical Activity: Not on file  Stress: Not on file  Social Connections: Not on file    Additional social history is that she is married for 49 years.  She has 3 sons, 5 grandchildren, and 2 great-grandchildren.  I take care of he patient's husband who recently had valve surgery  Family History:  The patient's family history includes Alcoholism in her father; Diabetes in her brother, father, and paternal grandmother; Diabetes Mellitus II in her mother; Heart disease in her brother; Lung cancer in her maternal grandfather.   Her mother died at age 91 with heart failure.  She has 4 deceased brothers and 3 brothers are alive.  ROS General: Negative; No fevers, chills, or night sweats;  HEENT: Negative; No changes in vision or hearing, sinus congestion, difficulty swallowing Pulmonary: Negative; No cough, wheezing, shortness of breath, hemoptysis Cardiovascular: See history of present illness GI: Negative; No nausea, vomiting, diarrhea, or abdominal pain GU: Negative; No dysuria, hematuria, or difficulty voiding Musculoskeletal: Status post recent L3-L4 back surgery Hematologic/Oncology: Negative; no easy bruising,  bleeding Endocrine: Negative; no heat/cold intolerance; no diabetes Neuro: Negative; no changes in balance, headaches Skin: Negative; No rashes or skin lesions Psychiatric: Negative; No behavioral problems, depression Sleep: Negative; No snoring, daytime sleepiness, hypersomnolence, bruxism, restless legs, hypnogognic hallucinations, no cataplexy Other comprehensive 14 point system review is negative.   PHYSICAL EXAM:   VS:  BP (!) 164/86   Pulse 72   Ht 5' 6"  (1.676 m)   Wt 177 lb 6.4 oz (80.5 kg)   SpO2 95%   BMI 28.63 kg/m     Repeat blood pressure by me was 148/84  Wt Readings from Last 3 Encounters:  06/11/20 177 lb 6.4 oz (80.5 kg)  01/17/19 180 lb (81.6 kg)  07/25/18 186 lb (84.4 kg)    General: Alert, oriented, no distress.  Skin: normal turgor, no rashes, warm and dry HEENT: Normocephalic, atraumatic. Pupils equal round and reactive to light; sclera anicteric; extraocular muscles intact;  Nose without nasal septal hypertrophy Mouth/Parynx benign; Mallinpatti scale 3 Neck: No JVD, no carotid bruits; normal carotid upstroke Lungs: clear to ausculatation and percussion; no wheezing or rales Chest wall: without tenderness to palpitation Heart: PMI not displaced, RRR, s1 s2 normal, 2/6 systolic murmur, no diastolic murmur, no rubs, gallops, thrills, or heaves Abdomen: soft, nontender; no hepatosplenomehaly, BS+; abdominal aorta nontender and not dilated by palpation. Back: no CVA tenderness Pulses 2+ Musculoskeletal: full range of motion, normal strength, no joint deformities Extremities: no clubbing cyanosis or edema, Homan's sign negative  Neurologic: grossly nonfocal; Cranial nerves grossly wnl Psychologic: Normal mood and affect    Studies/Labs Reviewed:   EKG:  EKG is ordered today. ECG (independently read by me): NSR at 72; LVH by voltage; QTc 468 msec  December 2020 ECG (independently read by me): Normal sinus rhythm at 63 bpm, LVH by voltage.  May 2018  ECG (independently read by me): normal sinus rhythm at 78 bpm.  Mild LVH by voltage criteria in aVL.  Normal intervals.  Recent Labs: BMP Latest Ref Rng & Units 07/21/2018 09/14/2016 07/03/2016  Glucose 70 - 99 mg/dL 105(H) 118(H) 130(H)  BUN 8 - 23 mg/dL 11 20 11  Creatinine 0.44 - 1.00 mg/dL 0.60 0.74 0.84  BUN/Creat Ratio 12 - 28 - 27 -  Sodium 135 - 145 mmol/L 140 142 139  Potassium 3.5 - 5.1 mmol/L 4.0 5.2 3.9  Chloride 98 - 111 mmol/L 108 106 106  CO2 22 - 32 mmol/L 26 24 27   Calcium 8.9 - 10.3 mg/dL 9.9 10.4(H) 10.0     Hepatic Function Latest Ref Rng & Units 09/14/2016  Total Protein 6.0 - 8.5 g/dL 6.7  Albumin 3.6 - 4.8 g/dL 4.4  AST 0 - 40 IU/L 28  ALT 0 - 32 IU/L 30  Alk Phosphatase 39 - 117 IU/L 105  Total Bilirubin 0.0 - 1.2 mg/dL 0.3    CBC Latest Ref Rng & Units 07/21/2018 07/03/2016 05/09/2015  WBC 4.0 - 10.5 K/uL 3.2(L) 4.7 3.6(L)  Hemoglobin 12.0 - 15.0 g/dL 13.2 12.6 13.7  Hematocrit 36.0 - 46.0 % 42.5 38.6 41.6  Platelets 150 - 400 K/uL 140(L) 158 171   Lab Results  Component Value Date   MCV 93.4 07/21/2018   MCV 88.9 07/03/2016   MCV 88.9 05/09/2015   No results found for: TSH No results found for: HGBA1C   BNP No results found for: BNP  ProBNP No results found for: PROBNP   Lipid Panel     Component Value Date/Time   CHOL 188 09/14/2016 0918   TRIG 55 09/14/2016 0918   HDL 100 09/14/2016 0918   CHOLHDL 1.9 09/14/2016 0918   LDLCALC 77 09/14/2016 0918     RADIOLOGY: No results found.   Additional studies/ records that were reviewed today include:  I reviewed the patient's records from [place]  Echo Doppler from June 2018 was reviewed  ASSESSMENT:    1. Murmur   2. Essential hypertension   3. Hyperlipidemia LDL goal <130   4. Gastroesophageal reflux disease without esophagitis   5. Rheumatoid arthritis, involving unspecified site, unspecified whether rheumatoid factor present ([place])   6. Mildly prolonged QT interval      PLAN:  Michelle Castaneda is a very pleasant 70 year old female who admits to a long-standing history of a cardiac murmur.  In the [place] records, there was mention of a possible bicuspid aortic valve.  I reviewed her 2D echo Doppler study from June 2018 which showed a structurally normal aortic valve.  There was no mention of a bicuspid valve.  She had mild aortic insufficiency.  There was no evidence for aortic valve stenosis or significant sclerosis.  She also had trace MR.  LV function was normal with EF estimated 60 to 65% with grade 1 diastolic dysfunction.  Over the last several years, she has required increasing medical therapy for blood pressure control.  Most recently she has been on amlodipine 7.5 mg daily.  Her blood pressure today continues to be elevated.  With her previous demonstration of diastolic dysfunction I have elected to add losartan.  She will take 25 mg for 4 days and then increase to 50 mg daily.  I am recommending a follow-up of her 2D echo Doppler study to reassess her systolic murmur as well as systolic and diastolic function and valvular architecture.  She continues to be on rosuvastatin 20 mg for hyperlipidemia.  She has not had recent laboratory.  I am recommending a comprehensive metabolic panel, CBC TSH and lipid studies.  She will monitor her blood pressure.  She continues to be on omeprazole for GERD which is well controlled.  She is also on hydroxychloroquine in addition  to leflunomide for her rheumatoid arthritis.  QTc interval is minimally increased at 468 ms. I will see her in 3 months for reevaluation.   Medication Adjustments/Labs and Tests Ordered: Current medicines are reviewed at length with the patient today.  Concerns regarding medicines are outlined above.  Medication changes, Labs and Tests ordered today are listed in the Patient Instructions below. Patient Instructions  Medication Instructions:  BEGIN Losartan 9m daily. Per Dr. KClaiborne Billings for the first 4  days, take a half tablet (238m. After the first 4 days, begin taking a whole tablet (501m   *If you need a refill on your cardiac medications before your next appointment, please call your pharmacy*   Lab Work: CMet, Lipids, TSH, CBC to be drawn in 1-2 weeks.    Testing/Procedures: [facility] have an echocardi[facility]. Echocardiography is a painless test that uses sound waves to create images of your heart. It provides your doctor with information about the size and shape of your heart and how well your heart's chambers and valves are working. This procedure takes approximately one hour. There are no restrictions for this procedure.   Follow-Up: [facility] are our priority.  As part of our continuing mission to provide you with exceptional heart care, we have created designated Provider Care Teams.  These Care Teams include your primary Cardiologist (physician) and Advanced Practice Providers (APPs -  Physician Assistants and Nurse Practitioners) who all work together to provide you with the care you need, when you need it.  We recommend signing up for the patient portal called "MyChart".  Sign up information is provided on this After Visit Summary.  MyChart is used to connect with patients for Virtual Visits (Telemedicine).  Patients are able to view lab/test results, encounter notes, upcoming appointments, etc.  Non-urgent messages can be sent to your provider as well.   To learn more about what you can do with MyChart, go to [email or web address]  Your next appointment:   3 month(s)  The format for your next appointment:   In Person  Provider:   ThoShelva MajesticD        Signed, ThoShelva MajesticD  06/13/2020 1:08 PM    [address]  [phone]: [phone]

## 2020-06-13 ENCOUNTER — Encounter: Payer: Self-pay | Admitting: Cardiovascular Disease

## 2020-07-08 DIAGNOSIS — I10 Essential (primary) hypertension: Secondary | ICD-10-CM | POA: Diagnosis not present

## 2020-07-08 DIAGNOSIS — R011 Cardiac murmur, unspecified: Secondary | ICD-10-CM | POA: Diagnosis not present

## 2020-07-08 DIAGNOSIS — E785 Hyperlipidemia, unspecified: Secondary | ICD-10-CM | POA: Diagnosis not present

## 2020-07-08 LAB — LIPID PANEL
Chol/HDL Ratio: 1.9 ratio (ref 0.0–4.4)
Cholesterol, Total: 184 mg/dL (ref 100–199)
HDL: 95 mg/dL (ref >39)
LDL Chol Calc (NIH): 80 mg/dL (ref 0–99)
Triglycerides: 46 mg/dL (ref 0–149)
VLDL Cholesterol Cal: 9 mg/dL (ref 5–40)

## 2020-07-08 LAB — COMPREHENSIVE METABOLIC PANEL
ALT: 20 IU/L (ref 0–32)
AST: 23 IU/L (ref 0–40)
Albumin/Globulin Ratio: 2.4 — ABNORMAL HIGH (ref 1.2–2.2)
Albumin: 4.3 g/dL (ref 3.7–4.7)
Alkaline Phosphatase: 105 IU/L (ref 44–121)
BUN/Creatinine Ratio: 28 (ref 12–28)
BUN: 16 mg/dL (ref 8–27)
Bilirubin Total: 0.2 mg/dL (ref 0.0–1.2)
CO2: 22 mmol/L (ref 20–29)
Calcium: 9.5 mg/dL (ref 8.7–10.3)
Chloride: 109 mmol/L — ABNORMAL HIGH (ref 96–106)
Creatinine, Ser: 0.57 mg/dL (ref 0.57–1.00)
Globulin, Total: 1.8 g/dL (ref 1.5–4.5)
Glucose: 99 mg/dL (ref 65–99)
Potassium: 4 mmol/L (ref 3.5–5.2)
Sodium: 145 mmol/L — ABNORMAL HIGH (ref 134–144)
Total Protein: 6.1 g/dL (ref 6.0–8.5)
eGFR: 97 mL/min/{1.73_m2} (ref >59)

## 2020-07-08 LAB — TSH: TSH: 0.777 u[IU]/mL (ref 0.450–4.500)

## 2020-07-08 LAB — CBC
Hematocrit: 38.4 % (ref 34.0–46.6)
Hemoglobin: 12.7 g/dL (ref 11.1–15.9)
MCH: 29.7 pg (ref 26.6–33.0)
MCHC: 33.1 g/dL (ref 31.5–35.7)
MCV: 90 fL (ref 79–97)
Platelets: 158 10*3/uL (ref 150–450)
RBC: 4.28 x10E6/uL (ref 3.77–5.28)
RDW: 12.4 % (ref 11.7–15.4)
WBC: 3.5 10*3/uL (ref 3.4–10.8)

## 2020-07-15 ENCOUNTER — Other Ambulatory Visit: Payer: Self-pay

## 2020-07-15 ENCOUNTER — Ambulatory Visit (HOSPITAL_COMMUNITY): Payer: Medicare PPO | Attending: Cardiology

## 2020-07-15 DIAGNOSIS — R011 Cardiac murmur, unspecified: Secondary | ICD-10-CM | POA: Diagnosis not present

## 2020-07-15 LAB — ECHOCARDIOGRAM COMPLETE
Area-P 1/2: 2.43 cm2
P 1/2 time: 1159 msec
S' Lateral: 2.5 cm

## 2020-07-20 ENCOUNTER — Other Ambulatory Visit: Payer: Self-pay | Admitting: Cardiovascular Disease

## 2020-08-07 DIAGNOSIS — M4310 Spondylolisthesis, site unspecified: Secondary | ICD-10-CM | POA: Diagnosis not present

## 2020-09-06 DIAGNOSIS — M150 Primary generalized (osteo)arthritis: Secondary | ICD-10-CM | POA: Diagnosis not present

## 2020-09-06 DIAGNOSIS — Z79899 Other long term (current) drug therapy: Secondary | ICD-10-CM | POA: Diagnosis not present

## 2020-09-06 DIAGNOSIS — R5383 Other fatigue: Secondary | ICD-10-CM | POA: Diagnosis not present

## 2020-09-06 DIAGNOSIS — E663 Overweight: Secondary | ICD-10-CM | POA: Diagnosis not present

## 2020-09-06 DIAGNOSIS — M2550 Pain in unspecified joint: Secondary | ICD-10-CM | POA: Diagnosis not present

## 2020-09-06 DIAGNOSIS — M0579 Rheumatoid arthritis with rheumatoid factor of multiple sites without organ or systems involvement: Secondary | ICD-10-CM | POA: Diagnosis not present

## 2020-09-06 DIAGNOSIS — Z6826 Body mass index (BMI) 26.0-26.9, adult: Secondary | ICD-10-CM | POA: Diagnosis not present

## 2020-09-06 DIAGNOSIS — M25572 Pain in left ankle and joints of left foot: Secondary | ICD-10-CM | POA: Diagnosis not present

## 2020-09-20 ENCOUNTER — Other Ambulatory Visit: Payer: Self-pay | Admitting: Cardiovascular Disease

## 2020-09-23 ENCOUNTER — Other Ambulatory Visit: Payer: Self-pay

## 2020-09-23 ENCOUNTER — Ambulatory Visit: Payer: Medicare PPO | Admitting: Cardiovascular Disease

## 2020-09-23 VITALS — BP 128/76 | HR 65 | Ht 66.0 in | Wt 168.8 lb

## 2020-09-23 DIAGNOSIS — E7800 Pure hypercholesterolemia, unspecified: Secondary | ICD-10-CM

## 2020-09-23 DIAGNOSIS — M069 Rheumatoid arthritis, unspecified: Secondary | ICD-10-CM

## 2020-09-23 DIAGNOSIS — I10 Essential (primary) hypertension: Secondary | ICD-10-CM | POA: Diagnosis not present

## 2020-09-23 DIAGNOSIS — I358 Other nonrheumatic aortic valve disorders: Secondary | ICD-10-CM | POA: Diagnosis not present

## 2020-09-23 NOTE — Patient Instructions (Signed)
Medication Instructions:  Continue current medications  *If you need a refill on your cardiac medications before your next appointment, please call your pharmacy*   Lab Work: None Ordered   Testing/Procedures: None Ordered   Follow-Up: At [facility], you and your health needs are our priority.  As part of our continuing mission to provide you with exceptional heart care, we have created designated Provider Care Teams.  These Care Teams include your primary Cardiologist (physician) and Advanced Practice Providers (APPs -  Physician Assistants and Nurse Practitioners) who all work together to provide you with the care you need, when you need it.  We recommend signing up for the patient portal called "MyChart".  Sign up information is provided on this After Visit Summary.  MyChart is used to connect with patients for Virtual Visits (Telemedicine).  Patients are able to view lab/test results, encounter notes, upcoming appointments, etc.  Non-urgent messages can be sent to your provider as well.   To learn more about what you can do with MyChart, go to [email or web address].    Your next appointment:   1 year(s)  The format for your next appointment:   In Person  Provider:   You may see Paisely Brick Kelly, MD or one of the following Advanced Practice Providers on your designated Care Team:   Hao Meng, PA-C Angela Duke, PA-C or  Krista Kroeger, PA-C     

## 2020-09-23 NOTE — Progress Notes (Signed)
Cardiology Office Note    Date:  09/29/2020   ID:  Michelle Castaneda, DOB 02-13-1950, MRN [id number]  PCP:  Lawerance Cruel, MD  Cardiologist:  Shelva Majestic, MD   41-monthfollow-up evaluation  History of Present Illness:  Michelle Castaneda a 70y.o. female who who was  referred through the courtesy of Dr. CTamsen Meekfor further evaluation of cardiac murmur and cardiology evaluation.  I saw her in May 2018 and last saw her in May 2022.  She presents for a 3 month follow-up evaluation.  Michelle Castaneda a history of rheumatoid arthritis, and is status post a parathyroidectomy and thyroid nodule removal.  She has been found to have a cardiac murmur and admits of being told of having this for a long time.  When I saw her for her initial visit, she did not recall having a previous cardiac echo.  At the time she denied any episodes of chest pain, presyncope or syncope, and awareness of change in ability to walk, PND or orthopnea.  She was exercising regularly and was in a exercise class 4 days per week for 30 minutes and on 2 other days typically walks so that she is exercising at least 6 days per week.  [place] the note of Dr. RHarrington Challenger  There is mention that the patient has a bicuspid aortic valve.  The patient is unaware of when this diagnosis was made. Review of laboratory indicates that she has significant hyperlipidemia which is untreated.  In February 2017.  Total cholesterol was 325 with an LDL of 210 and a non-HDL of 220.  HDL was 105.  In May 2018.  Total cholesterol was 300 with an LDL of 186, and HDL remained high at 102 with normal triglycerides at 60.    When I initially saw her, I recommended she undergo a 2D echo Doppler study which was done on July 14, 2016.  This revealed normal systolic function with a EF of 60 to 65%.  There was grade 1 diastolic dysfunction.  Aortic valve appeared structurally normal.  There was no evidence for  stenosis.  There was mild aortic insufficiency.  There was trivial tricuspid regurgitation.  She was subsequently seen by BBernerd Pho PMesa Vistain August 2018.  I saw her in December 2020.,  At that time she was on amlodipine 2.5 mg for hypertension but approximately 6 weeks ago due to elevated blood pressure this dose was increased by Dr. AMelinda Crutchto 5 mg daily.  She iwason rosuvastatin 20 mg for hyperlipidemia.  Recent laboratory on December 16, 2018 showed total cholesterol 190, HDL 91, LDL 90, and triglycerides 45.  She has rheumatoid arthritis and is on Hydrea chloroquine in addition to leflunomide.  She has GERD on omeprazole.  She has a prescription for ibuprofen but rarely takes this.  She states that since she has been on the increased amlodipine her blood pressure has been running in the mid 1579Usystolically.  She denies any leg swelling.  She denies any chest pain.  She has not been exercising as she had in the past during this COVID-19 pandemic.  She also had undergone low back surgery several months ago at L3-L4 by Dr. HDeri Fuelling  During that evaluation, her blood pressure was elevated and I recommended slight titration of amlodipine to 7.5 mg daily.  I last saw her on Jun 11, 2020 and since her prior evaluation she continued  to be followed by Dr. Harrington Challenger for primary care.  She was on amlodipine 7.5 mg for hypertension and s hydroxychloroquine and leflunomide for her rheumatoid arthritis.  She continued on rosuvastatin 20 mg for hyperlipidemia and omeprazole for GERD.  During her evaluation, her blood pressure was elevated.  With her previously documented diastolic dysfunction I elected to add at losartan initially 25 mg for 4 days with dose increased to 50 mg.  I recommended she undergo a follow-up echo Doppler study for reassessment of her systolic murmur and systolic and diastolic function.  She underwent an echo Doppler study on July 15, 2020.  EF was 65 to 70%.  Diastolic parameters were  indeterminate.  There was mild aortic sclerosis without stenosis.  Presently, she feels well.  She has been walking, rides an exercise bike, and does water aerobics 5 days/week.  She denies any chest pain or palpitations.  She presents for follow-up evaluation.  Past Medical History:  Diagnosis Date   DDD (degenerative disc disease), lumbar    GERD (gastroesophageal reflux disease)    Heart murmur    a. 06/2016: echo showing EF of 60-65% with Grade 1 DD and mild AI.    Hyperlipidemia    Hypertension    Pneumonia    RA (rheumatoid arthritis) ([place])    Spondylolisthesis of lumbar region    Wears dentures    Wears glasses     Past Surgical History:  Procedure Laterality Date   BREAST SURGERY     CERVICAL POLYPECTOMY N/A 05/30/2015   Procedure: POLYPECTOMY;  Surgeon: Christophe Louis, MD;  Location: [facility];  Service: Gynecology;  Laterality: N/A;   COLONOSCOPY     DIAGNOSTIC LAPAROSCOPY     DILATATION & CURETTAGE/HYSTEROSCOPY WITH MYOSURE N/A 05/30/2015   Procedure: DILATATION & CURETTAGE/HYSTEROSCOPY WITH MYOSURE;  Surgeon: Christophe Louis, MD;  Location: [facility];  Service: Gynecology;  Laterality: N/A;   LAPAROSCOPY FOR ECTOPIC PREGNANCY     MULTIPLE TOOTH EXTRACTIONS     THYROID SURGERY     TUBAL LIGATION      Current Medications: Outpatient Medications Prior to Visit  Medication Sig Dispense Refill   amLODipine (NORVASC) 5 MG tablet Take 1.5 tablets (7.5 mg total) by mouth daily. 135 tablet 3   cyclobenzaprine (FLEXERIL) 10 MG tablet Take by mouth.     gabapentin (NEURONTIN) 300 MG capsule TAKE 1 CAPSULE BY MOUTH EVERYDAY AT BEDTIME     hydroxychloroquine (PLAQUENIL) 200 MG tablet Take 200 mg by mouth 2 (two) times daily.     ibuprofen (ADVIL,MOTRIN) 800 MG tablet Take 1 tablet (800 mg total) by mouth every 8 (eight) hours as needed for mild pain or moderate pain. 30 tablet 1   losartan (COZAAR) 50 MG tablet Take 1 tablet (50 mg total) by mouth daily. 90 tablet 3   Multiple Vitamin  (MULTIVITAMIN WITH MINERALS) TABS tablet Take 1 tablet by mouth every other day.     naphazoline-pheniramine (VISINE) 0.025-0.3 % ophthalmic solution 1 drop into both eyes as needed for itching     omeprazole (PRILOSEC) 20 MG capsule Take 1 capsule (20 mg total) by mouth daily. 30 capsule 0   Polyethyl Glycol-Propyl Glycol (SYSTANE OP) Place 1 drop into both eyes daily as needed (itching).     rosuvastatin (CRESTOR) 20 MG tablet TAKE 1 TABLET (20 MG TOTAL) BY MOUTH DAILY. KEEP UPCOMING APPOINTMENT 90 tablet 2   Tetrahydrozoline HCl (VISINE OP) Place 1 drop into both eyes daily as needed (itching eyes).  leflunomide (ARAVA) 20 MG tablet Take 20 mg by mouth daily.     No facility-administered medications prior to visit.     Allergies:   Aspirin, Iodine, Naprosyn [naproxen], and Tramadol   Social History   Socioeconomic History   Marital status: Married    Spouse name: Not on file   Number of children: Not on file   Years of education: Not on file   Highest education level: Not on file  Occupational History   Not on file  Tobacco Use   Smoking status: Former    Types: Cigarettes   Smokeless tobacco: Never  Vaping Use   Vaping Use: Never used  Substance and Sexual Activity   Alcohol use: Yes    Comment: occ   Drug use: No   Sexual activity: Not on file  Other Topics Concern   Not on file  Social History Narrative   Not on file   Social Determinants of Health   Financial Resource Strain: Not on file  Food Insecurity: Not on file  Transportation Needs: Not on file  Physical Activity: Not on file  Stress: Not on file  Social Connections: Not on file    Additional social history is that she is married for 49 years.  She has 3 sons, 5 grandchildren, and 2 great-grandchildren.  I take care of he patient's husband who recently had valve surgery  Family History:  The patient's family history includes Alcoholism in her father; Diabetes in her brother, father, and paternal  grandmother; Diabetes Mellitus II in her mother; Heart disease in her brother; Lung cancer in her maternal grandfather.   Her mother died at age 19 with heart failure.  She has 4 deceased brothers and 3 brothers are alive.  ROS General: Negative; No fevers, chills, or night sweats;  HEENT: Negative; No changes in vision or hearing, sinus congestion, difficulty swallowing Pulmonary: Negative; No cough, wheezing, shortness of breath, hemoptysis Cardiovascular: See history of present illness GI: Negative; No nausea, vomiting, diarrhea, or abdominal pain GU: Negative; No dysuria, hematuria, or difficulty voiding Musculoskeletal: Status post recent L3-L4 back surgery Hematologic/Oncology: Negative; no easy bruising, bleeding Endocrine: Negative; no heat/cold intolerance; no diabetes Neuro: Negative; no changes in balance, headaches Skin: Negative; No rashes or skin lesions Psychiatric: Negative; No behavioral problems, depression Sleep: Negative; No snoring, daytime sleepiness, hypersomnolence, bruxism, restless legs, hypnogognic hallucinations, no cataplexy Other comprehensive 14 point system review is negative.   PHYSICAL EXAM:   VS:  BP 128/76   Pulse 65   Ht 5' 6"  (1.676 m)   Wt 168 lb 12.8 oz (76.6 kg)   SpO2 98%   BMI 27.25 kg/m     Repeat blood pressure by me was 122/76  Wt Readings from Last 3 Encounters:  09/23/20 168 lb 12.8 oz (76.6 kg)  06/11/20 177 lb 6.4 oz (80.5 kg)  01/17/19 180 lb (81.6 kg)    General: Alert, oriented, no distress.  Skin: normal turgor, no rashes, warm and dry HEENT: Normocephalic, atraumatic. Pupils equal round and reactive to light; sclera anicteric; extraocular muscles intact;  Nose without nasal septal hypertrophy Mouth/Parynx benign; Mallinpatti scale 3 Neck: No JVD, no carotid bruits; normal carotid upstroke Lungs: clear to ausculatation and percussion; no wheezing or rales Chest wall: without tenderness to palpitation Heart: PMI not  displaced, RRR, s1 s2 normal, 1/6 systolic murmur, no diastolic murmur, no rubs, gallops, thrills, or heaves Abdomen: soft, nontender; no hepatosplenomehaly, BS+; abdominal aorta nontender and not dilated by palpation. Back:  no CVA tenderness Pulses 2+ Musculoskeletal: full range of motion, normal strength, no joint deformities Extremities: no clubbing cyanosis or edema, Homan's sign negative  Neurologic: grossly nonfocal; Cranial nerves grossly wnl Psychologic: Normal mood and affect   Studies/Labs Reviewed:   September 23, 2020 ECG (independently read by me): NSR at 65; LAD, LVH by voltage; no ectopy, normal intervals    Jun 11, 2020 ECG (independently read by me): NSR at 72; LVH by voltage; QTc 468 msec  December 2020 ECG (independently read by me): Normal sinus rhythm at 63 bpm, LVH by voltage.  May 2018 ECG (independently read by me): normal sinus rhythm at 78 bpm.  Mild LVH by voltage criteria in aVL.  Normal intervals.  Recent Labs: BMP Latest Ref Rng & Units 07/08/2020 07/21/2018 09/14/2016  Glucose 65 - 99 mg/dL 99 105(H) 118(H)  BUN 8 - 27 mg/dL 16 11 20   Creatinine 0.57 - 1.00 mg/dL 0.57 0.60 0.74  BUN/Creat Ratio 12 - 28 28 - 27  Sodium 134 - 144 mmol/L 145(H) 140 142  Potassium 3.5 - 5.2 mmol/L 4.0 4.0 5.2  Chloride 96 - 106 mmol/L 109(H) 108 106  CO2 20 - 29 mmol/L 22 26 24   Calcium 8.7 - 10.3 mg/dL 9.5 9.9 10.4(H)     Hepatic Function Latest Ref Rng & Units 07/08/2020 09/14/2016  Total Protein 6.0 - 8.5 g/dL 6.1 6.7  Albumin 3.7 - 4.7 g/dL 4.3 4.4  AST 0 - 40 IU/L 23 28  ALT 0 - 32 IU/L 20 30  Alk Phosphatase 44 - 121 IU/L 105 105  Total Bilirubin 0.0 - 1.2 mg/dL 0.2 0.3    CBC Latest Ref Rng & Units 07/08/2020 07/21/2018 07/03/2016  WBC 3.4 - 10.8 x10E3/uL 3.5 3.2(L) 4.7  Hemoglobin 11.1 - 15.9 g/dL 12.7 13.2 12.6  Hematocrit 34.0 - 46.6 % 38.4 42.5 38.6  Platelets 150 - 450 x10E3/uL 158 140(L) 158   Lab Results  Component Value Date   MCV 90 07/08/2020   MCV  93.4 07/21/2018   MCV 88.9 07/03/2016   Lab Results  Component Value Date   TSH 0.777 07/08/2020   No results found for: HGBA1C   BNP No results found for: BNP  ProBNP No results found for: PROBNP   Lipid Panel     Component Value Date/Time   CHOL 184 07/08/2020 0820   TRIG 46 07/08/2020 0820   HDL 95 07/08/2020 0820   CHOLHDL 1.9 07/08/2020 0820   LDLCALC 80 07/08/2020 0820     RADIOLOGY: No results found.   Additional studies/ records that were reviewed today include:  I reviewed the patient's records from [facility]  Echo Doppler from June 2018 was reviewed  ECHO: 07/15/2020 IMPRESSIONS   1. Left ventricular ejection fraction, by estimation, is 65 to 70%. The  left ventricle has normal function. The left ventricle has no regional  wall motion abnormalities. Left ventricular diastolic parameters are  indeterminate.   2. Right ventricular systolic function is normal. The right ventricular  size is normal. There is normal pulmonary artery systolic pressure.   3. The mitral valve is normal in structure. Trivial mitral valve  regurgitation. No evidence of mitral stenosis.   4. The aortic valve is tricuspid. Aortic valve regurgitation is mild.  Mild aortic valve sclerosis is present, with no evidence of aortic valve  stenosis.   5. The inferior vena cava is normal in size with greater than 50%  respiratory variability, suggesting right atrial pressure of 3  mmHg.   Comparison(s): No significant change from prior study.   Conclusion(s)/Recommendation(s): Otherwise normal echocardiogram, with  minor abnormalities described in the report.   ASSESSMENT:    1. Essential hypertension   2. Aortic valve sclerosis   3. Pure hypercholesterolemia   4. Rheumatoid arthritis, involving unspecified site, unspecified whether rheumatoid factor present [facility])     PLAN:  Ms. Alexi Dorminey is a 70 year-old female who admits to a long-standing history of a cardiac murmur.   In the [facility] records, there was mention of a possible bicuspid aortic valve.  I reviewed her 2D echo Doppler study from June 2018 which showed a structurally normal aortic valve.  There was no mention of a bicuspid valve.  She had mild aortic insufficiency.  There was no evidence for aortic valve stenosis or significant sclerosis.  She also had trace MR.  LV function was normal with EF estimated 60 to 65% with grade 1 diastolic dysfunction.  Over the last several years she required medication titration for more optimal blood pressure control.  When I saw her in May 2022 her blood pressure was elevated on amlodipine 7.5 mg.  With her previous demonstration of mild diastolic dysfunction and mild blood pressure elevation at that time I recommended initiation of ARB therapy with losartan and she has been titrated to 50 mg daily.  I obtained a follow-up echo Doppler study on July 23, 2020.  LV function remains hyperdynamic.  There were no wall motion abnormalities.  Left ventricular diastolic parameters were indeterminate.  There was evidence for mild aortic sclerosis without stenosis.  Estimated right ventricular systolic pressure was normal.  Her blood pressure today is improved with the addition of losartan to her medical regimen.  Her ECG is stable with normal sinus rhythm but does suggest mild LVH.  Her systolic murmur is stable.  I commended her on her continued activity of doing some type of exercise 5 days/week.  I reviewed laboratory from June 2022.  Hemoglobin and hematocrit were stable at 12.7/38.4.  Serum creatinine was 0.57.  She continues to be on rosuvastatin 20 mg.  Lipid studies showed total cholesterol 184, triglycerides 46, HDL was excellent at 95 and LDL 80.  She will continue current therapy and follow-up with Dr. Harrington Challenger.  I will see her in 1 year for follow-up evaluation.   Medication Adjustments/Labs and Tests Ordered: Current medicines are reviewed at length with the patient today.  Concerns  regarding medicines are outlined above.  Medication changes, Labs and Tests ordered today are listed in the Patient Instructions below. Patient Instructions  Medication Instructions:  Continue current medications  *If you need a refill on your cardiac medications before your next appointment, please call your pharmacy*   Lab Work: None Ordered   Testing/Procedures: None Ordered   Follow-Up: At [facility], you and your health needs are our priority.  As part of our continuing mission to provide you with exceptional heart care, we have created designated Provider Care Teams.  These Care Teams include your primary Cardiologist (physician) and Advanced Practice Providers (APPs -  Physician Assistants and Nurse Practitioners) who all work together to provide you with the care you need, when you need it.  We recommend signing up for the patient portal called "MyChart".  Sign up information is provided on this After Visit Summary.  MyChart is used to connect with patients for Virtual Visits (Telemedicine).  Patients are able to view lab/test results, encounter notes, upcoming appointments, etc.  Non-urgent messages  can be sent to your provider as well.   To learn more about what you can do with MyChart, go to [email or web address].    Your next appointment:   1 year(s)  The format for your next appointment:   In Person  Provider:   You may see Shelva Majestic, MD or one of the following Advanced Practice Providers on your designated Care Team:   Almyra Deforest, PA-C Fabian Sharp, PA-C or  Roby Lofts, [place]     Signed, Shelva Majestic, MD  09/29/2020 4:44 PM    [facility] [address] [place], [place], [place]  [address] Phone: [phone]

## 2020-09-29 ENCOUNTER — Encounter: Payer: Self-pay | Admitting: Cardiovascular Disease

## 2020-10-02 DIAGNOSIS — M0579 Rheumatoid arthritis with rheumatoid factor of multiple sites without organ or systems involvement: Secondary | ICD-10-CM | POA: Diagnosis not present

## 2020-10-07 DIAGNOSIS — M545 Low back pain, unspecified: Secondary | ICD-10-CM | POA: Diagnosis not present

## 2020-10-07 DIAGNOSIS — E663 Overweight: Secondary | ICD-10-CM | POA: Diagnosis not present

## 2020-10-07 DIAGNOSIS — K219 Gastro-esophageal reflux disease without esophagitis: Secondary | ICD-10-CM | POA: Diagnosis not present

## 2020-10-07 DIAGNOSIS — E785 Hyperlipidemia, unspecified: Secondary | ICD-10-CM | POA: Diagnosis not present

## 2020-10-07 DIAGNOSIS — M069 Rheumatoid arthritis, unspecified: Secondary | ICD-10-CM | POA: Diagnosis not present

## 2020-10-07 DIAGNOSIS — I10 Essential (primary) hypertension: Secondary | ICD-10-CM | POA: Diagnosis not present

## 2020-10-07 DIAGNOSIS — M1990 Unspecified osteoarthritis, unspecified site: Secondary | ICD-10-CM | POA: Diagnosis not present

## 2020-10-07 DIAGNOSIS — G8929 Other chronic pain: Secondary | ICD-10-CM | POA: Diagnosis not present

## 2020-10-07 DIAGNOSIS — I951 Orthostatic hypotension: Secondary | ICD-10-CM | POA: Diagnosis not present

## 2020-10-09 ENCOUNTER — Telehealth: Payer: Self-pay | Admitting: Cardiovascular Disease

## 2020-10-09 NOTE — Telephone Encounter (Signed)
In home wellness exam should that he patient was orthostatic hypotension. When sitting down the patient bp was 144/79 HR 77 and standing up 122/78 HR 76. Please advise

## 2020-10-09 NOTE — Telephone Encounter (Signed)
Returned call to pt. She state yesterday she had her home wellness exam and was instructed by the nurse to contact our office due to being orthostatic. Pt report BP while sitting was 144/79 HR 77 then standing 122/78 HR 76. Pt state she was asymptomatic but still concerned.   Appointment scheduled for 9/22 for further evaluations. Pt also instructed to change positions slowly in the meantime or seek urgent care if she have symptoms of feeling like she's going to black out.  Pt verbalized understanding

## 2020-10-17 ENCOUNTER — Other Ambulatory Visit: Payer: Self-pay

## 2020-10-17 ENCOUNTER — Encounter: Payer: Self-pay | Admitting: Physician Assistant

## 2020-10-17 ENCOUNTER — Ambulatory Visit: Payer: Medicare PPO | Admitting: Physician Assistant

## 2020-10-17 VITALS — BP 126/80 | HR 90 | Ht 66.0 in | Wt 167.6 lb

## 2020-10-17 DIAGNOSIS — E785 Hyperlipidemia, unspecified: Secondary | ICD-10-CM | POA: Diagnosis not present

## 2020-10-17 DIAGNOSIS — I10 Essential (primary) hypertension: Secondary | ICD-10-CM | POA: Diagnosis not present

## 2020-10-17 DIAGNOSIS — Z0131 Encounter for examination of blood pressure with abnormal findings: Secondary | ICD-10-CM | POA: Diagnosis not present

## 2020-10-17 NOTE — Patient Instructions (Signed)
Medication Instructions:  Your physician recommends that you continue on your current medications as directed. Please refer to the Current Medication list given to you today.  *If you need a refill on your cardiac medications before your next appointment, please call your pharmacy*  Lab Work: NONE ordered at this time of appointment   If you have labs (blood work) drawn today and your tests are completely normal, you will receive your results only by: . MyChart Message (if you have MyChart) OR . A paper copy in the mail If you have any lab test that is abnormal or we need to change your treatment, we will call you to review the results.  Testing/Procedures: NONE ordered at this time of appointment   Follow-Up: At [facility], you and your health needs are our priority.  As part of our continuing mission to provide you with exceptional heart care, we have created designated Provider Care Teams.  These Care Teams include your primary Cardiologist (physician) and Advanced Practice Providers (APPs -  Physician Assistants and Nurse Practitioners) who all work together to provide you with the care you need, when you need it.  Your next appointment:   1 year(s)  The format for your next appointment:   In Person  Provider:   Thomas Kelly, MD  Other Instructions   

## 2020-10-17 NOTE — Progress Notes (Signed)
Cardiology Office Note:    Date:  10/19/2020   ID:  Michelle Castaneda, DOB November 04, 1950, MRN [id number]  PCP:  Daisy Floro, MD   [facility] Providers Cardiologist:  Nicki Guadalajara, MD     Referring MD: Daisy Floro, MD   Chief Complaint  Patient presents with   Follow-up    Seen for Dr. Tresa Endo     History of Present Illness:    Michelle Castaneda is a 69 y.o. female with a hx of hypertension, hyperlipidemia, and rheumatoid arthritis.  Patient was initially referred to cardiology service for evaluation of heart murmur.  Her regular family ago physician mention possibility of bicuspid aortic valve.  Echocardiogram obtained on 07/14/2016 showed EF 60 to 65%, grade 1 DD, mild AI.  Aortic valve appears to be structurally normal.  Repeat echocardiogram in June 2022 showed EF 65 to 70%, indeterminate diastolic parameter, mild aortic sclerosis without stenosis.  Patient was last seen by Dr. Tresa Endo on 09/23/2020 at which time she was doing well.  Her cholesterol was well controlled, 1 year follow-up was recommended.  Patient really recently had a [employer] nurse that went to her home to check her blood pressure.  Her sitting blood pressure was noted to be 144/79, heart rate of 77, standing blood pressure was 122/78, heart rate 76.  She was told she has orthostatic vital signs, patient was completely asymptomatic.  She became concerned and came to the cardiology office to see [place].  We obtained a full orthostatic vital signs, her blood pressure really did not drop by much.  She denies any recent dizziness, blurred vision feeling of passing out.  At this time I do not recommend any further adjustment on medication.  She can follow-up in 1 year.  Sitting blood pressure 136/83 O2 saturation 98%, pulse 73 Lying blood pressure 126/77 O2 100% pulse 73 Standing blood pressure 126/80 O2 97% pulse 90 Standing 3-minute blood pressure 139/87 O2 99% pulse 92.  Past Medical History:  Diagnosis Date    DDD (degenerative disc disease), lumbar    GERD (gastroesophageal reflux disease)    Heart murmur    a. 06/2016: echo showing EF of 60-65% with Grade 1 DD and mild AI.    Hyperlipidemia    Hypertension    Pneumonia    RA (rheumatoid arthritis) (HCC)    Spondylolisthesis of lumbar region    Wears dentures    Wears glasses     Past Surgical History:  Procedure Laterality Date   BREAST SURGERY     CERVICAL POLYPECTOMY N/A 05/30/2015   Procedure: POLYPECTOMY;  Surgeon: Gerald Leitz, MD;  Location: [facility];  Service: Gynecology;  Laterality: N/A;   COLONOSCOPY     DIAGNOSTIC LAPAROSCOPY     DILATATION & CURETTAGE/HYSTEROSCOPY WITH MYOSURE N/A 05/30/2015   Procedure: DILATATION & CURETTAGE/HYSTEROSCOPY WITH MYOSURE;  Surgeon: Gerald Leitz, MD;  Location: [facility];  Service: Gynecology;  Laterality: N/A;   LAPAROSCOPY FOR ECTOPIC PREGNANCY     MULTIPLE TOOTH EXTRACTIONS     THYROID SURGERY     TUBAL LIGATION      Current Medications: Current Meds  Medication Sig   amLODipine (NORVASC) 5 MG tablet Take 1.5 tablets (7.5 mg total) by mouth daily.   cyclobenzaprine (FLEXERIL) 10 MG tablet Take by mouth.   gabapentin (NEURONTIN) 100 MG capsule 1 capsule   gabapentin (NEURONTIN) 300 MG capsule TAKE 1 CAPSULE BY MOUTH EVERYDAY AT BEDTIME   hydroxychloroquine (PLAQUENIL) 200 MG tablet Take 200 mg  by mouth 2 (two) times daily.   ibuprofen (ADVIL,MOTRIN) 800 MG tablet Take 1 tablet (800 mg total) by mouth every 8 (eight) hours as needed for mild pain or moderate pain.   losartan (COZAAR) 50 MG tablet Take 1 tablet (50 mg total) by mouth daily.   Multiple Vitamin (MULTIVITAMIN WITH MINERALS) TABS tablet Take 1 tablet by mouth every other day.   naphazoline-pheniramine (VISINE) 0.025-0.3 % ophthalmic solution 1 drop into both eyes as needed for itching   omeprazole (PRILOSEC) 20 MG capsule Take 1 capsule (20 mg total) by mouth daily.   Polyethyl Glycol-Propyl Glycol (SYSTANE OP) Place 1 drop into both eyes  daily as needed (itching).   rosuvastatin (CRESTOR) 20 MG tablet TAKE 1 TABLET (20 MG TOTAL) BY MOUTH DAILY. KEEP UPCOMING APPOINTMENT     Allergies:   Aspirin, Iodine, Naprosyn [naproxen], and Tramadol   Social History   Socioeconomic History   Marital status: Married    Spouse name: Not on file   Number of children: Not on file   Years of education: Not on file   Highest education level: Not on file  Occupational History   Not on file  Tobacco Use   Smoking status: Former    Types: Cigarettes   Smokeless tobacco: Never  Vaping Use   Vaping Use: Never used  Substance and Sexual Activity   Alcohol use: Yes    Comment: occ   Drug use: No   Sexual activity: Not on file  Other Topics Concern   Not on file  Social History Narrative   Not on file   Social Determinants of Health   Financial Resource Strain: Not on file  Food Insecurity: Not on file  Transportation Needs: Not on file  Physical Activity: Not on file  Stress: Not on file  Social Connections: Not on file     Family History: The patient's family history includes Alcoholism in her father; Diabetes in her brother, father, and paternal grandmother; Diabetes Mellitus II in her mother; Heart disease in her brother; Lung cancer in her maternal grandfather. There is no history of Breast cancer.  ROS:   Please see the history of present illness.     All other systems reviewed and are negative.  EKGs/Labs/Other Studies Reviewed:    The following studies were reviewed today:  Echo 07/15/2020 1. Left ventricular ejection fraction, by estimation, is 65 to 70%. The  left ventricle has normal function. The left ventricle has no regional  wall motion abnormalities. Left ventricular diastolic parameters are  indeterminate.   2. Right ventricular systolic function is normal. The right ventricular  size is normal. There is normal pulmonary artery systolic pressure.   3. The mitral valve is normal in structure. Trivial  mitral valve  regurgitation. No evidence of mitral stenosis.   4. The aortic valve is tricuspid. Aortic valve regurgitation is mild.  Mild aortic valve sclerosis is present, with no evidence of aortic valve  stenosis.   5. The inferior vena cava is normal in size with greater than 50%  respiratory variability, suggesting right atrial pressure of 3 mmHg.   Comparison(s): No significant change from prior study.   EKG:  EKG is not ordered today.   Recent Labs: 07/08/2020: ALT 20; BUN 16; Creatinine, Ser 0.57; Hemoglobin 12.7; Platelets 158; Potassium 4.0; Sodium 145; TSH 0.777  Recent Lipid Panel    Component Value Date/Time   CHOL 184 07/08/2020 0820   TRIG 46 07/08/2020 0820   HDL 95  07/08/2020 0820   CHOLHDL 1.9 07/08/2020 0820   LDLCALC 80 07/08/2020 0820     Risk Assessment/Calculations:           Physical Exam:    VS:  BP 126/80 (BP Location: Left Arm, Patient Position: Sitting, Cuff Size: Normal)   Pulse 90   Ht 5\' 6"  (1.676 m)   Wt 167 lb 9.6 oz (76 kg)   SpO2 97%   BMI 27.05 kg/m     Wt Readings from Last 3 Encounters:  10/17/20 167 lb 9.6 oz (76 kg)  09/23/20 168 lb 12.8 oz (76.6 kg)  06/11/20 177 lb 6.4 oz (80.5 kg)     GEN:  Well nourished, well developed in no acute distress HEENT: Normal NECK: No JVD; No carotid bruits LYMPHATICS: No lymphadenopathy CARDIAC: RRR, no murmurs, rubs, gallops RESPIRATORY:  Clear to auscultation without rales, wheezing or rhonchi  ABDOMEN: Soft, non-tender, non-distended MUSCULOSKELETAL:  No edema; No deformity  SKIN: Warm and dry NEUROLOGIC:  Alert and oriented x 3 PSYCHIATRIC:  Normal affect   ASSESSMENT:    1. Encounter for examination of blood pressure with abnormal findings   2. Primary hypertension   3. Hyperlipidemia LDL goal <100    PLAN:    In order of problems listed above:  Abnormal blood pressure: The reason for today's visit is she was recently told that she has orthostatic hypotension by [facility]  nurse who checked her blood pressure at home.  We checked her orthostatic vital signs, see above.  She only had mild drop in blood pressure, not severe enough to be called orthostatic hypotension.  Talking with the patient, she denies any recent dizziness, blurred vision or feeling of passing out.  She is completely asymptomatic.  Even with a mild drop in the systolic blood pressure, her blood pressure still consider relatively normal.  At this time I do not recommend any adjustment of her blood pressure medication.  We will continue observation.  Hypertension: Blood pressure well controlled on current therapy  Hyperlipidemia: On Crestor.     Medication Adjustments/Labs and Tests Ordered: Current medicines are reviewed at length with the patient today.  Concerns regarding medicines are outlined above.  No orders of the defined types were placed in this encounter.  No orders of the defined types were placed in this encounter.   Patient Instructions  Medication Instructions:  Your physician recommends that you continue on your current medications as directed. Please refer to the Current Medication list given to you today.  *If you need a refill on your cardiac medications before your next appointment, please call your pharmacy*  Lab Work: NONE ordered at this time of appointment   If you have labs (blood work) drawn today and your tests are completely normal, you will receive your results only by: MyChart Message (if you have MyChart) OR A paper copy in the mail If you have any lab test that is abnormal or we need to change your treatment, we will call you to review the results.  Testing/Procedures: NONE ordered at this time of appointment   Follow-Up: At [facility], you and your health needs are our priority.  As part of our continuing mission to provide you with exceptional heart care, we have created designated Provider Care Teams.  These Care Teams include your primary  Cardiologist (physician) and Advanced Practice Providers (APPs -  Physician Assistants and Nurse Practitioners) who all work together to provide you with the care you need, when you need  it.  Your next appointment:   1 year(s)  The format for your next appointment:   In Person  Provider:   Nicki Guadalajara, MD  Other Instructions    Signed, Azalee Course, PA  10/19/2020 11:27 PM    [facility]

## 2020-10-19 ENCOUNTER — Encounter: Payer: Self-pay | Admitting: Physician Assistant

## 2020-10-30 DIAGNOSIS — M0579 Rheumatoid arthritis with rheumatoid factor of multiple sites without organ or systems involvement: Secondary | ICD-10-CM | POA: Diagnosis not present

## 2020-11-07 DIAGNOSIS — M4310 Spondylolisthesis, site unspecified: Secondary | ICD-10-CM | POA: Diagnosis not present

## 2020-12-04 DIAGNOSIS — K219 Gastro-esophageal reflux disease without esophagitis: Secondary | ICD-10-CM | POA: Diagnosis not present

## 2020-12-04 DIAGNOSIS — Z23 Encounter for immunization: Secondary | ICD-10-CM | POA: Diagnosis not present

## 2020-12-04 DIAGNOSIS — E7800 Pure hypercholesterolemia, unspecified: Secondary | ICD-10-CM | POA: Diagnosis not present

## 2020-12-04 DIAGNOSIS — I10 Essential (primary) hypertension: Secondary | ICD-10-CM | POA: Diagnosis not present

## 2020-12-04 DIAGNOSIS — Z0000 Encounter for general adult medical examination without abnormal findings: Secondary | ICD-10-CM | POA: Diagnosis not present

## 2020-12-06 DIAGNOSIS — M2550 Pain in unspecified joint: Secondary | ICD-10-CM | POA: Diagnosis not present

## 2020-12-06 DIAGNOSIS — R5383 Other fatigue: Secondary | ICD-10-CM | POA: Diagnosis not present

## 2020-12-06 DIAGNOSIS — M0579 Rheumatoid arthritis with rheumatoid factor of multiple sites without organ or systems involvement: Secondary | ICD-10-CM | POA: Diagnosis not present

## 2020-12-06 DIAGNOSIS — M5431 Sciatica, right side: Secondary | ICD-10-CM | POA: Diagnosis not present

## 2020-12-06 DIAGNOSIS — Z6827 Body mass index (BMI) 27.0-27.9, adult: Secondary | ICD-10-CM | POA: Diagnosis not present

## 2020-12-06 DIAGNOSIS — M150 Primary generalized (osteo)arthritis: Secondary | ICD-10-CM | POA: Diagnosis not present

## 2020-12-06 DIAGNOSIS — D72819 Decreased white blood cell count, unspecified: Secondary | ICD-10-CM | POA: Diagnosis not present

## 2020-12-06 DIAGNOSIS — M5136 Other intervertebral disc degeneration, lumbar region: Secondary | ICD-10-CM | POA: Diagnosis not present

## 2020-12-06 DIAGNOSIS — Z79899 Other long term (current) drug therapy: Secondary | ICD-10-CM | POA: Diagnosis not present

## 2020-12-24 IMAGING — MG MM DIGITAL SCREENING BILAT W/ TOMO W/ CAD
8 series · 8 of 24 positions shown · non-contrast
Comparison: Previous exam(s).

CLINICAL DATA: Screening.

EXAM:
DIGITAL SCREENING BILATERAL MAMMOGRAM WITH TOMO AND CAD

[R MLO synth-2D]
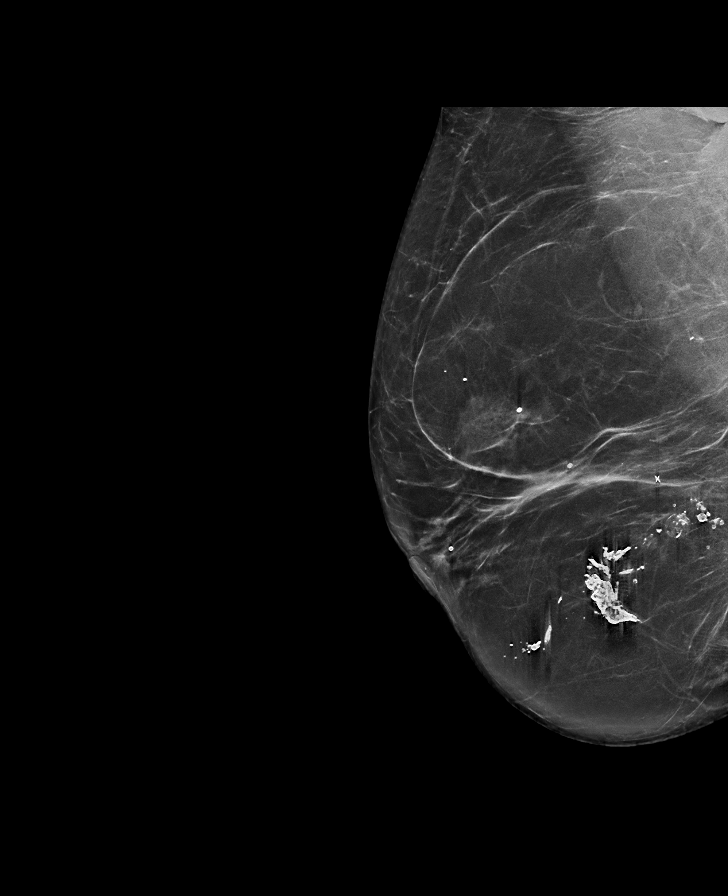

[L MLO synth-2D]
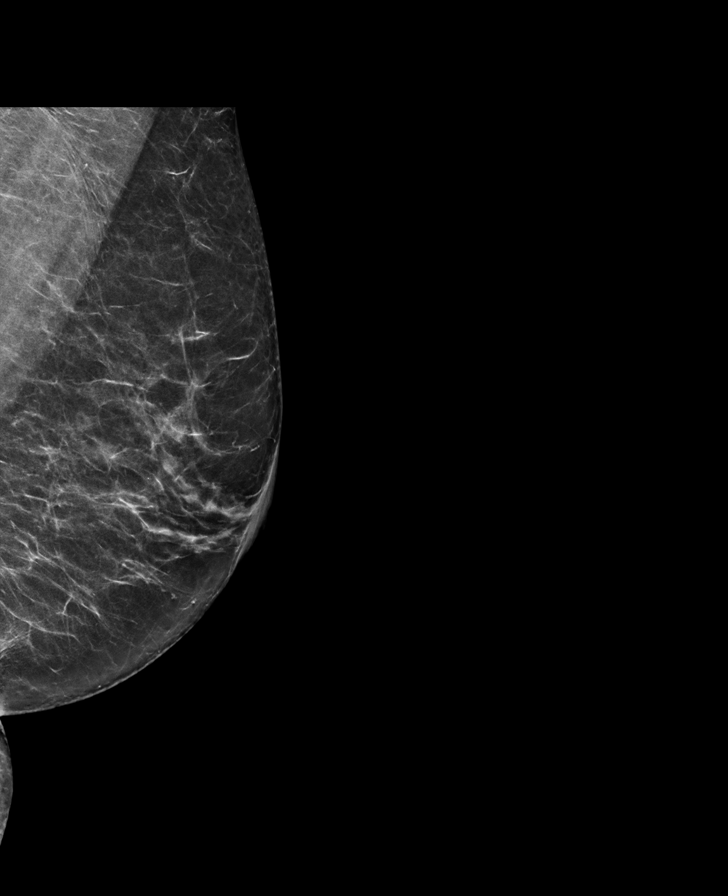

[L CC synth-2D]
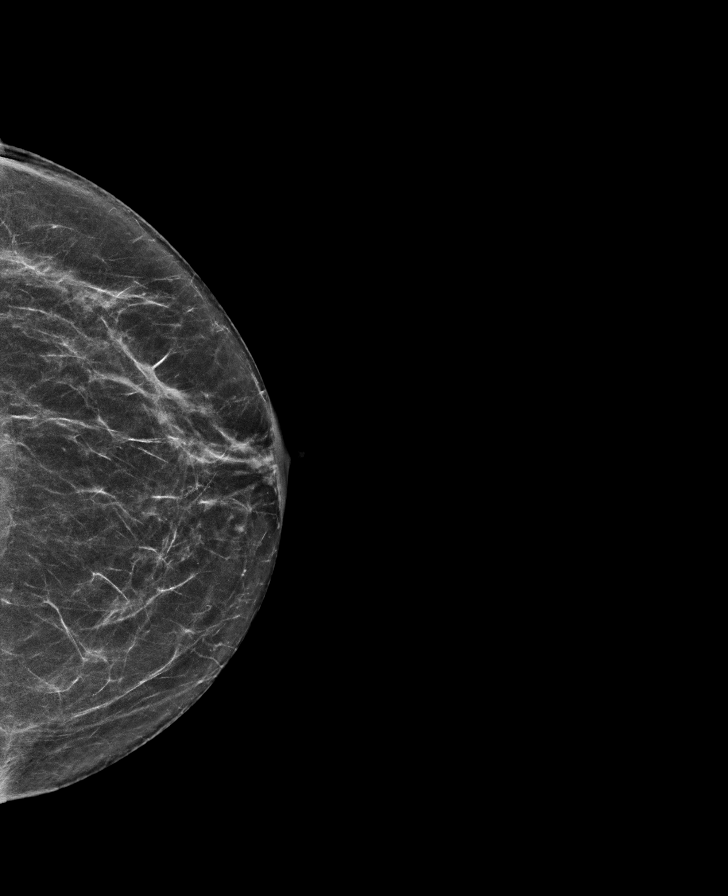

[R CC synth-2D]
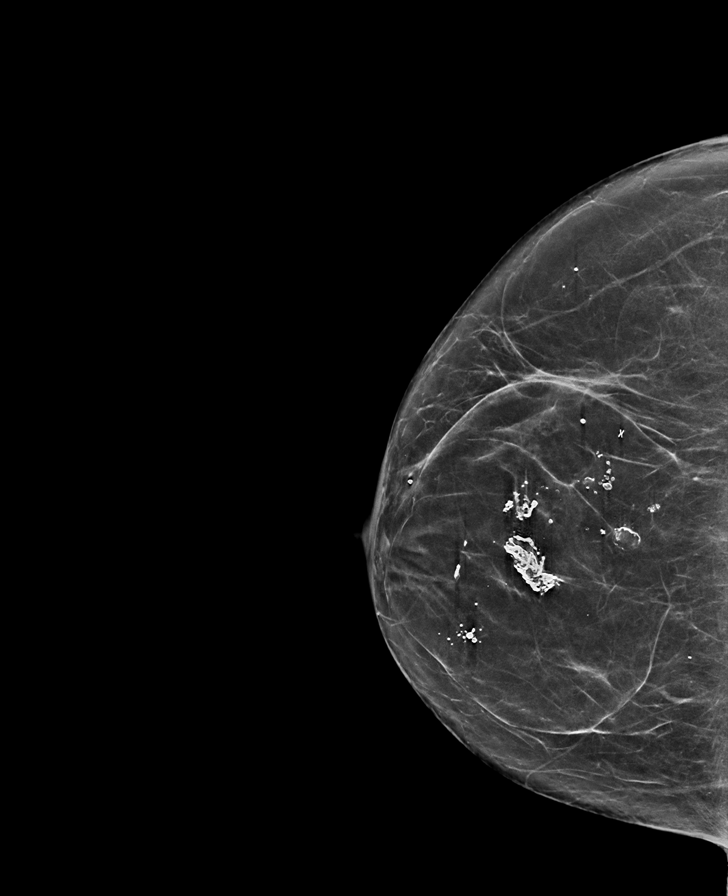

[L CC tomo · tomo slice 33/65.0]
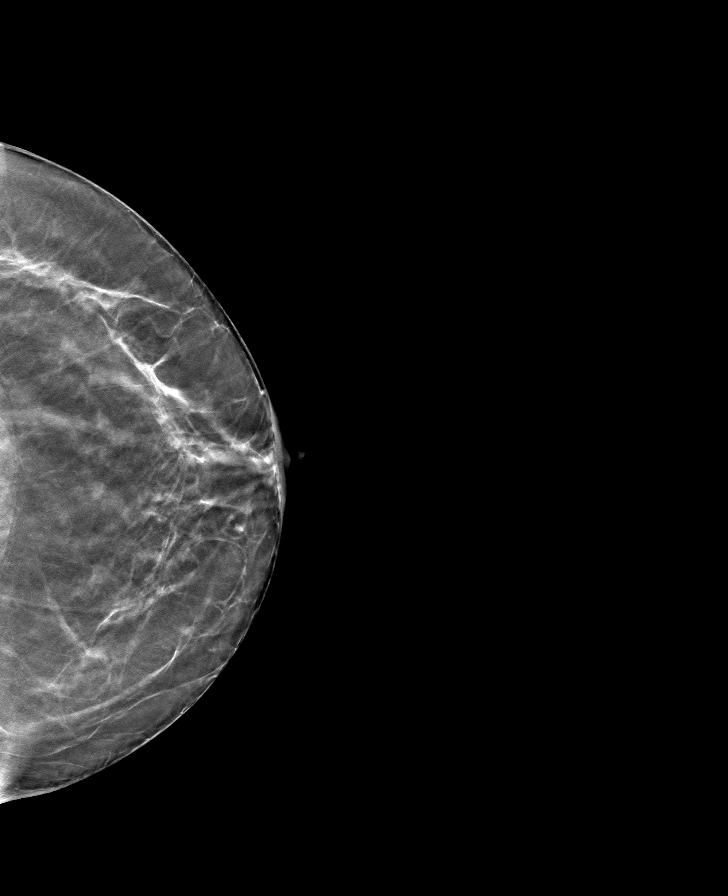

[R CC tomo · tomo slice 35/69.0]
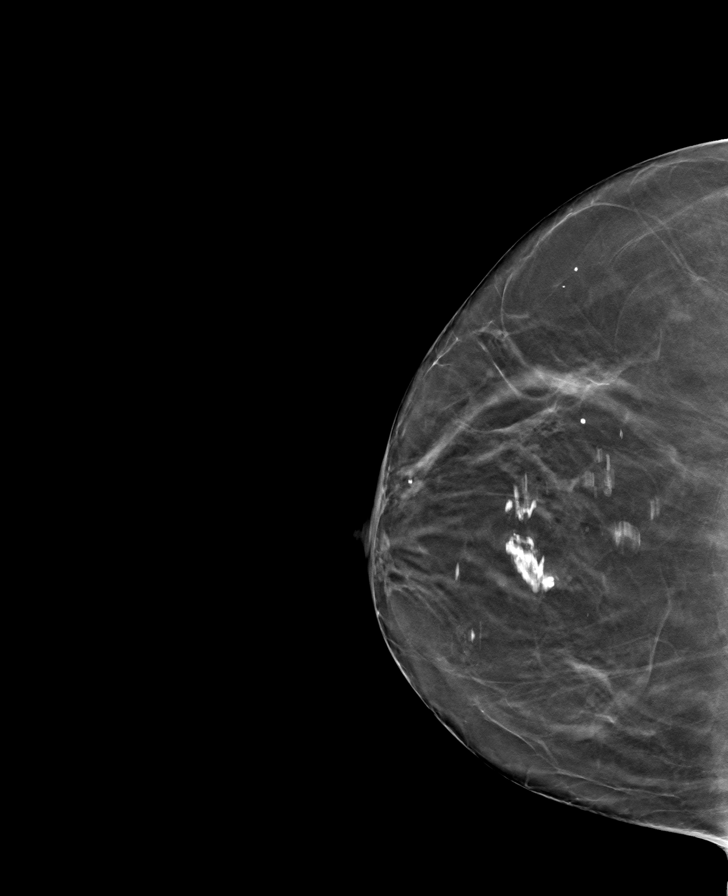

[L MLO tomo · tomo slice 35/69.0]
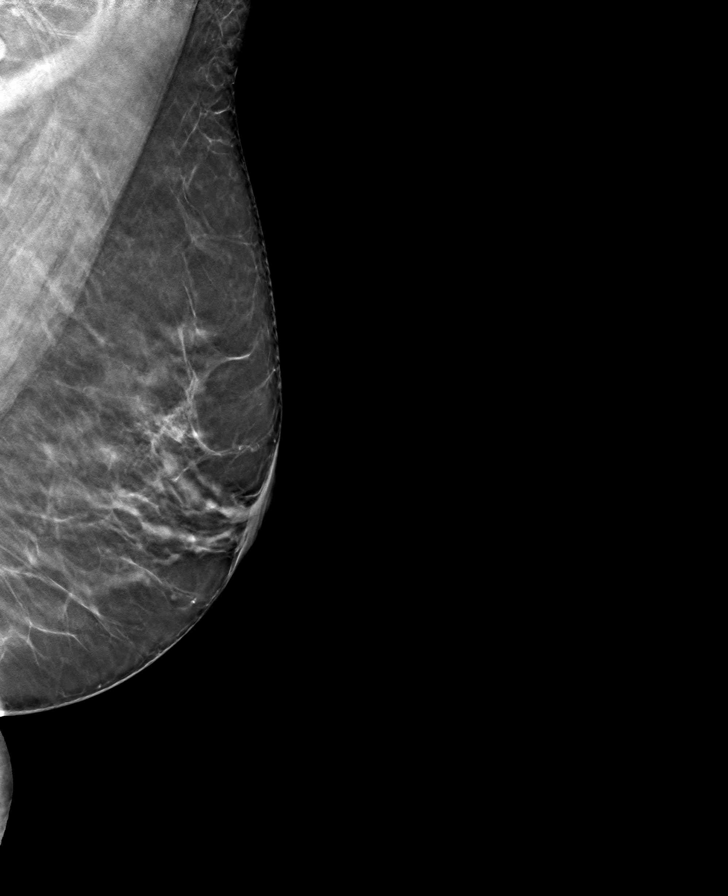

[R MLO tomo · tomo slice 39/78.0]
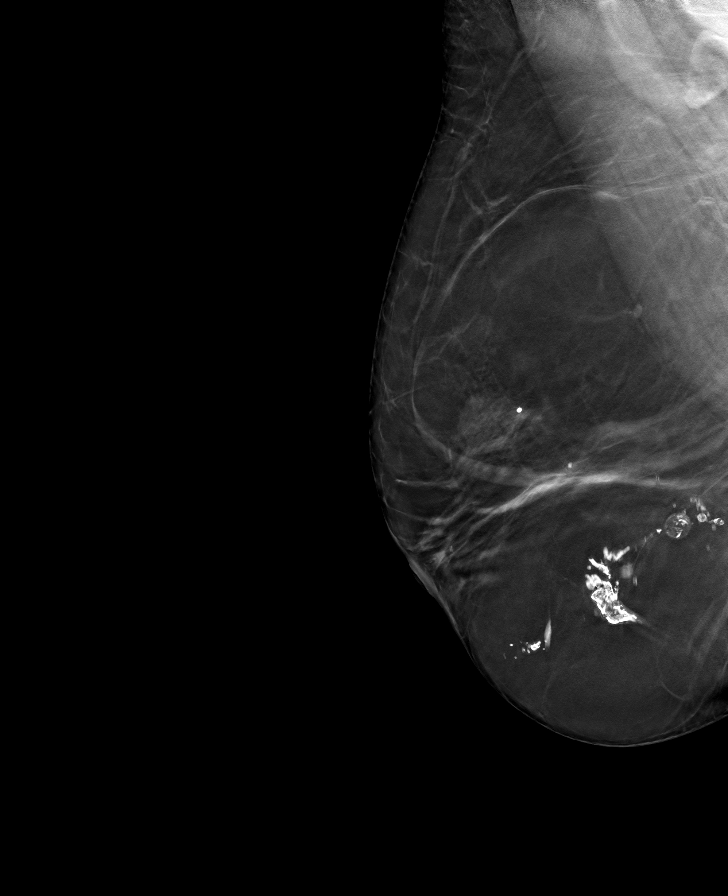

[8 of 24 positions shown; findings below may reference images not displayed]

ACR Breast Density Category b: There are scattered areas of
fibroglandular density.
FINDINGS: There are no findings suspicious for malignancy. Images were
processed with CAD.
IMPRESSION: No mammographic evidence of malignancy. A result letter of this
screening mammogram will be mailed directly to the patient.

RECOMMENDATION:
Screening mammogram in one year. (Code:[id number])

BI-RADS CATEGORY  1: Negative.

## 2020-12-25 DIAGNOSIS — M0579 Rheumatoid arthritis with rheumatoid factor of multiple sites without organ or systems involvement: Secondary | ICD-10-CM | POA: Diagnosis not present

## 2021-02-03 DIAGNOSIS — M069 Rheumatoid arthritis, unspecified: Secondary | ICD-10-CM | POA: Diagnosis not present

## 2021-02-03 DIAGNOSIS — Z79891 Long term (current) use of opiate analgesic: Secondary | ICD-10-CM | POA: Diagnosis not present

## 2021-02-17 DIAGNOSIS — I499 Cardiac arrhythmia, unspecified: Secondary | ICD-10-CM | POA: Diagnosis not present

## 2021-02-17 DIAGNOSIS — M0550 Rheumatoid polyneuropathy with rheumatoid arthritis of unspecified site: Secondary | ICD-10-CM | POA: Diagnosis not present

## 2021-02-17 DIAGNOSIS — K219 Gastro-esophageal reflux disease without esophagitis: Secondary | ICD-10-CM | POA: Diagnosis not present

## 2021-02-17 DIAGNOSIS — I10 Essential (primary) hypertension: Secondary | ICD-10-CM | POA: Diagnosis not present

## 2021-02-17 DIAGNOSIS — M1990 Unspecified osteoarthritis, unspecified site: Secondary | ICD-10-CM | POA: Diagnosis not present

## 2021-02-17 DIAGNOSIS — E663 Overweight: Secondary | ICD-10-CM | POA: Diagnosis not present

## 2021-02-17 DIAGNOSIS — E785 Hyperlipidemia, unspecified: Secondary | ICD-10-CM | POA: Diagnosis not present

## 2021-02-17 DIAGNOSIS — M8580 Other specified disorders of bone density and structure, unspecified site: Secondary | ICD-10-CM | POA: Diagnosis not present

## 2021-02-17 DIAGNOSIS — R32 Unspecified urinary incontinence: Secondary | ICD-10-CM | POA: Diagnosis not present

## 2021-02-19 DIAGNOSIS — M0579 Rheumatoid arthritis with rheumatoid factor of multiple sites without organ or systems involvement: Secondary | ICD-10-CM | POA: Diagnosis not present

## 2021-03-11 DIAGNOSIS — M2550 Pain in unspecified joint: Secondary | ICD-10-CM | POA: Diagnosis not present

## 2021-03-11 DIAGNOSIS — Z6827 Body mass index (BMI) 27.0-27.9, adult: Secondary | ICD-10-CM | POA: Diagnosis not present

## 2021-03-11 DIAGNOSIS — M150 Primary generalized (osteo)arthritis: Secondary | ICD-10-CM | POA: Diagnosis not present

## 2021-03-11 DIAGNOSIS — Z79899 Other long term (current) drug therapy: Secondary | ICD-10-CM | POA: Diagnosis not present

## 2021-03-11 DIAGNOSIS — M5136 Other intervertebral disc degeneration, lumbar region: Secondary | ICD-10-CM | POA: Diagnosis not present

## 2021-03-11 DIAGNOSIS — M0579 Rheumatoid arthritis with rheumatoid factor of multiple sites without organ or systems involvement: Secondary | ICD-10-CM | POA: Diagnosis not present

## 2021-03-11 DIAGNOSIS — E663 Overweight: Secondary | ICD-10-CM | POA: Diagnosis not present

## 2021-03-27 DIAGNOSIS — M5416 Radiculopathy, lumbar region: Secondary | ICD-10-CM | POA: Diagnosis not present

## 2021-03-27 DIAGNOSIS — Z6828 Body mass index (BMI) 28.0-28.9, adult: Secondary | ICD-10-CM | POA: Diagnosis not present

## 2021-03-27 DIAGNOSIS — I10 Essential (primary) hypertension: Secondary | ICD-10-CM | POA: Diagnosis not present

## 2021-04-16 DIAGNOSIS — M0579 Rheumatoid arthritis with rheumatoid factor of multiple sites without organ or systems involvement: Secondary | ICD-10-CM | POA: Diagnosis not present

## 2021-04-24 DIAGNOSIS — M5416 Radiculopathy, lumbar region: Secondary | ICD-10-CM | POA: Diagnosis not present

## 2021-05-02 ENCOUNTER — Other Ambulatory Visit: Payer: Self-pay

## 2021-05-05 ENCOUNTER — Other Ambulatory Visit: Payer: Self-pay

## 2021-05-05 DIAGNOSIS — M0579 Rheumatoid arthritis with rheumatoid factor of multiple sites without organ or systems involvement: Secondary | ICD-10-CM | POA: Diagnosis not present

## 2021-05-05 MED ORDER — AMLODIPINE BESYLATE 5 MG PO TABS: 7.5000 mg | ORAL_TABLET | Freq: Every day | ORAL | 2 refills | Status: DC

## 2021-05-05 MED ORDER — LOSARTAN POTASSIUM 50 MG PO TABS: 50.0000 mg | ORAL_TABLET | Freq: Every day | ORAL | 2 refills | Status: DC

## 2021-05-05 MED ORDER — ROSUVASTATIN CALCIUM 20 MG PO TABS: 20.0000 mg | ORAL_TABLET | Freq: Every day | ORAL | 2 refills | Status: DC

## 2021-05-19 DIAGNOSIS — M0579 Rheumatoid arthritis with rheumatoid factor of multiple sites without organ or systems involvement: Secondary | ICD-10-CM | POA: Diagnosis not present

## 2021-05-23 ENCOUNTER — Other Ambulatory Visit: Payer: Self-pay | Admitting: Cardiovascular Disease

## 2021-05-28 ENCOUNTER — Ambulatory Visit: Payer: Medicare PPO | Admitting: Cardiovascular Disease

## 2021-05-29 ENCOUNTER — Ambulatory Visit (INDEPENDENT_AMBULATORY_CARE_PROVIDER_SITE_OTHER): Payer: Medicare PPO

## 2021-05-29 ENCOUNTER — Ambulatory Visit: Payer: Medicare PPO | Admitting: Physician Assistant

## 2021-05-29 ENCOUNTER — Encounter: Payer: Self-pay | Admitting: Physician Assistant

## 2021-05-29 VITALS — BP 132/80 | HR 59 | Ht 65.0 in | Wt 175.0 lb

## 2021-05-29 DIAGNOSIS — I10 Essential (primary) hypertension: Secondary | ICD-10-CM

## 2021-05-29 DIAGNOSIS — R002 Palpitations: Secondary | ICD-10-CM

## 2021-05-29 DIAGNOSIS — E785 Hyperlipidemia, unspecified: Secondary | ICD-10-CM

## 2021-05-29 MED ORDER — AMLODIPINE BESYLATE 2.5 MG PO TABS: 2.5000 mg | ORAL_TABLET | Freq: Every day | ORAL | 3 refills | Status: DC

## 2021-05-29 MED ORDER — AMLODIPINE BESYLATE 5 MG PO TABS: 5.0000 mg | ORAL_TABLET | Freq: Every day | ORAL | 3 refills | Status: DC

## 2021-05-29 NOTE — Progress Notes (Signed)
?Cardiology Office Note:   ? ?Date:  05/29/2021  ? ?ID:  Michelle Castaneda, DOB September 16, 1950, MRN [id number] ? ?PCP:  Lawerance Cruel, MD ?[place] Cardiologist: Shelva Majestic, MD  ? ?Reason for visit: Palpitations ? ?History of Present Illness:   ? ?Michelle Castaneda is a 71 y.o. female with a hx of hx of hypertension, hyperlipidemia, and rheumatoid arthritis.  Patient was initially referred to cardiology service for evaluation of heart murmur.  Her regular family ago physician mention possibility of bicuspid aortic valve.  Echocardiogram obtained on 07/14/2016 showed EF 60 to 65%, grade 1 DD, mild AI.  Aortic valve appears to be structurally normal.  Repeat echocardiogram in June 2022 showed EF 65 to 70%, indeterminate diastolic parameter, mild aortic sclerosis without stenosis.   ? ?She was last seen in our office September 2022.  There was concern about blood pressure.  She had negative orthostatics. ? ?Today, she comes in concerned of new heart fluttering sensation she has felt for the last 1-1/2 months.  She states she tends to feel after her morning coffee and when she is getting out of the car.  She feels a brief flutter sensation that goes from her central chest to her throat.  She has no associated symptoms with it.  Otherwise she exercises for almost 3 hours daily at the [facility] where she walks, bicycles and does water aerobics.  With this exercise, she denies chest pain and shortness of breath.  She has chronic fatigue from her rheumatoid arthritis.  She states new med changes with related to her rheumatoid arthritis - she is on Cimzia injections every 2 weeks.  Otherwise she denies PND, orthopnea, lower extreme edema, lightheadedness, syncope.   ? ?  ?Past Medical History:  ?Diagnosis Date  ? DDD (degenerative disc disease), lumbar   ? GERD (gastroesophageal reflux disease)   ? Heart murmur   ? a. 06/2016: echo showing EF of 60-65% with Grade 1 DD and mild AI.   ? Hyperlipidemia   ? Hypertension    ? Pneumonia   ? RA (rheumatoid arthritis) ([place])   ? Spondylolisthesis of lumbar region   ? Wears dentures   ? Wears glasses   ? ? ?Past Surgical History:  ?Procedure Laterality Date  ? BREAST SURGERY    ? CERVICAL POLYPECTOMY N/A 05/30/2015  ? Procedure: POLYPECTOMY;  Surgeon: Christophe Louis, MD;  Location: [place] [facility];  Service: Gynecology;  Laterality: N/A;  ? COLONOSCOPY    ? DIAGNOSTIC LAPAROSCOPY    ? DILATATION & CURETTAGE/HYSTEROSCOPY WITH MYOSURE N/A 05/30/2015  ? Procedure: DILATATION & CURETTAGE/HYSTEROSCOPY WITH MYOSURE;  Surgeon: Christophe Louis, MD;  Location: [facility];  Service: Gynecology;  Laterality: N/A;  ? LAPAROSCOPY FOR ECTOPIC PREGNANCY    ? MULTIPLE TOOTH EXTRACTIONS    ? THYROID SURGERY    ? TUBAL LIGATION    ? ? ?Current Medications: ?Current Meds  ?Medication Sig  ? amLODipine (NORVASC) 2.5 MG tablet Take 1 tablet (2.5 mg total) by mouth daily.  ? amLODipine (NORVASC) 5 MG tablet Take 1 tablet (5 mg total) by mouth daily.  ? certolizumab pegol (CIMZIA) 2 X 200 MG KIT 423m  ? cyclobenzaprine (FLEXERIL) 5 MG tablet Take by mouth.  ? gabapentin (NEURONTIN) 100 MG capsule 100 mg 3 (three) times daily.  ? gabapentin (NEURONTIN) 300 MG capsule TAKE 1 CAPSULE BY MOUTH EVERYDAY AT BEDTIME  ? hydroxychloroquine (PLAQUENIL) 200 MG tablet Take 200 mg by mouth 2 (two) times daily.  ? ibuprofen (  ADVIL,MOTRIN) 800 MG tablet Take 1 tablet (800 mg total) by mouth every 8 (eight) hours as needed for mild pain or moderate pain.  ? losartan (COZAAR) 50 MG tablet Take 1 tablet (50 mg total) by mouth daily.  ? Multiple Vitamin (MULTIVITAMIN WITH MINERALS) TABS tablet Take 1 tablet by mouth every other day.  ? naphazoline-pheniramine (VISINE) 0.025-0.3 % ophthalmic solution 1 drop into both eyes as needed for itching  ? omeprazole (PRILOSEC) 20 MG capsule Take 1 capsule (20 mg total) by mouth daily.  ? Polyethyl Glycol-Propyl Glycol (SYSTANE OP) Place 1 drop into both eyes daily as needed (itching).  ? rosuvastatin (CRESTOR) 20 MG  tablet Take 1 tablet (20 mg total) by mouth daily. TAKE 1 TABLET (20 MG TOTAL) BY MOUTH DAILY.  ? [DISCONTINUED] amLODipine (NORVASC) 5 MG tablet Take 1.5 tablets (7.5 mg total) by mouth daily.  ? [DISCONTINUED] cyclobenzaprine (FLEXERIL) 10 MG tablet Take by mouth.  ?  ? ?Allergies:   Aspirin, Iodine, Naprosyn [naproxen], and Tramadol  ? ?Social History  ? ?Socioeconomic History  ? Marital status: Married  ?  Spouse name: Not on file  ? Number of children: Not on file  ? Years of education: Not on file  ? Highest education level: Not on file  ?Occupational History  ? Not on file  ?Tobacco Use  ? Smoking status: Former  ?  Types: Cigarettes  ? Smokeless tobacco: Never  ?Vaping Use  ? Vaping Use: Never used  ?Substance and Sexual Activity  ? Alcohol use: Yes  ?  Comment: occ  ? Drug use: No  ? Sexual activity: Not on file  ?Other Topics Concern  ? Not on file  ?Social History Narrative  ? Not on file  ? ?Social Determinants of Health  ? ?Financial Resource Strain: Not on file  ?Food Insecurity: Not on file  ?Transportation Needs: Not on file  ?Physical Activity: Not on file  ?Stress: Not on file  ?Social Connections: Not on file  ?  ? ?Family History: ?The patient's family history includes Alcoholism in her father; Diabetes in her brother, father, and paternal grandmother; Diabetes Mellitus II in her mother; Heart disease in her brother; Lung cancer in her maternal grandfather. There is no history of Breast cancer. ? ?ROS:   ?Please see the history of present illness.    ? ?EKGs/Labs/Other Studies Reviewed:   ? ?EKG:  The ekg ordered today demonstrates sinus bradycardia, heart rate 59, moderate voltage criteria for LVH.  No change from prior. ? ?Recent Labs: ?07/08/2020: ALT 20; BUN 16; Creatinine, Ser 0.57; Hemoglobin 12.7; Platelets 158; Potassium 4.0; Sodium 145; TSH 0.777  ? ?Recent Lipid Panel ?Lab Results  ?Component Value Date/Time  ? CHOL 184 07/08/2020 08:20 AM  ? TRIG 46 07/08/2020 08:20 AM  ? HDL 95  07/08/2020 08:20 AM  ? LDLCALC 80 07/08/2020 08:20 AM  ? ? ?Physical Exam:   ? ?VS:  BP 132/80   Pulse (!) 59   Ht 5' 5" (1.651 m)   Wt 175 lb (79.4 kg)   SpO2 99%   BMI 29.12 kg/m?    ?No data found. ? ?Wt Readings from Last 3 Encounters:  ?05/29/21 175 lb (79.4 kg)  ?10/17/20 167 lb 9.6 oz (76 kg)  ?09/23/20 168 lb 12.8 oz (76.6 kg)  ?  ? ?GEN:  Well nourished, well developed in no acute distress ?HEENT: Normal ?NECK: No JVD; No carotid bruits ?CARDIAC: RRR, no murmurs, rubs, gallops ?RESPIRATORY:  Clear to  auscultation without rales, wheezing or rhonchi  ?ABDOMEN: Soft, non-tender, non-distended ?MUSCULOSKELETAL: No edema; No deformity  ?SKIN: Warm and dry ?NEUROLOGIC:  Alert and oriented ?PSYCHIATRIC:  Normal affect  ? ?  ?ASSESSMENT AND PLAN  ? ?Palpitations ?-EKG shows normal sinus rhythm today. ?-Normal TSH, CBC and electrolytes in the past year.  She states she had recent blood work last week (unable to see in epic).   ?-Recommend decrease caffeine use. ?-Given a handout on palpitations. ?-Order 2-week ZIO monitor particularly to rule out atrial fibrillation. ? ?Hypertension, well controlled ?-Patient has difficulty having her amlodipine tablet. -Requests 2 prescriptions for amlodipine - 5 mg and 2.5 mg to take together to make 7.5mg dose. ?-Goal BP is <130/80.  Recommend DASH diet (high in vegetables, fruits, low-fat dairy products, whole grains, poultry, fish, and nuts and low in sweets, sugar-sweetened beverages, and red meats), salt restriction and increase physical activity. ? ?Hyperlipidemia with goal LDL less than 100 ?-LDL 101 in November 2022.  Continue Crestor. ?-Discussed cholesterol lowering diets - Mediterranean diet, DASH diet, vegetarian diet, low-carbohydrate diet and avoidance of trans fats.  Discussed healthier choice substitutes.  Nuts, high-fiber foods, and fiber supplements may also improve lipids.   ? ?Disposition - Follow-up in 3 months with Dr. Kelly.  We will follow-up on the  results of the ZIO monitor when we get them. ? ? ?Medication Adjustments/Labs and Tests Ordered: ?Current medicines are reviewed at length with the patient today.  Concerns regarding medicines are outlined above.

## 2021-05-29 NOTE — Patient Instructions (Signed)
Medication Instructions:  ?No changes ?*If you need a refill on your cardiac medications before your next appointment, please call your pharmacy* ? ? ?Lab Work: ?No Labs ?If you have labs (blood work) drawn today and your tests are completely normal, you will receive your results only by: ?MyChart Message (if you have MyChart) OR ?A paper copy in the mail ?If you have any lab test that is abnormal or we need to change your treatment, we will call you to review the results. ? ? ?Testing/Procedures: ?ZIO AT Long term monitor-Live Telemetry ? ?Your physician has requested you wear a ZIO patch monitor for 14 days.  ?This is a single patch monitor. [employer] supplies one patch monitor per enrollment. Additional  ?stickers are not available.  ?Please do not apply patch if you will be having a Nuclear Stress Test, Echocardiogram, Cardiac CT, MRI,  ?or Chest Xray during the period you would be wearing the monitor. The patch cannot be worn during  ?these tests. You cannot remove and re-apply the ZIO AT patch monitor.  ?Your ZIO patch monitor will be mailed 3 day USPS to your address on file. It may take 3-5 days to  ?receive your monitor after you have been enrolled.  ?Once you have received your monitor, please review the enclosed instructions. Your monitor has  ?already been registered assigning a specific monitor serial # to you.  ? ?Billing and Patient Assistance Program information ? ?[employer] has been supplied with any insurance information on record for billing. ?[employer] offers a sliding scale Patient Assistance Program for patients without insurance, or whose  ?insurance does not completely cover the cost of the ZIO patch monitor. You must apply for the  ?Patient Assistance Program to qualify for the discounted rate. To apply, call [employer] at [phone],  ?select option 4, select option 2 , ask to apply for the Patient Assistance Program, (you can request an  ?interpreter if needed). [employer] will ask your household  income and how many people are in your  ?household. [employer] will quote your out-of-pocket cost based on this information. They will also be able  ?to set up a 12 month interest free payment plan if needed. ? ?Applying the monitor  ? ?Shave hair from upper left chest.  ?Hold the abrader disc by orange tab. Rub the abrader in 40 strokes over left upper chest as indicated in  ?your monitor instructions.  ?Clean area with 4 enclosed alcohol pads. Use all pads to ensure the area is cleaned thoroughly. Let  ?dry.  ?Apply patch as indicated in monitor instructions. Patch will be placed under collarbone on left side of  ?chest with arrow pointing upward.  ?Rub patch adhesive wings for 2 minutes. Remove the white label marked "1". Remove the white label  ?marked "2". Rub patch adhesive wings for 2 additional minutes.  ?While looking in a mirror, press and release button in center of patch. A small green light will flash 3-4  ?times. This will be your only indicator that the monitor has been turned on.  ?Do not shower for the first 24 hours. You may shower after the first 24 hours.  ?Press the button if you feel a symptom. You will hear a small click. Record Date, Time and Symptom in  ?the Patient Log.  ? ?Starting the Gateway ? ?In your kit there is a small plastic box the size of a cellphone. This is Airline pilot. It transmits all your  ?recorded data to [employer]. This box must  always stay within 10 feet of you. Open the box and push the *  ?button. There will be a light that blinks orange and then green a few times. When the light stops  ?blinking, the Gateway is connected to the ZIO patch. ?Call [employer] at [phone] to confirm your monitor is transmitting. ? ?Returning your monitor ? ?Remove your patch and place it inside the Gateway. In the lower half of the Gateway there is a white  ?bag with prepaid postage on it. Place Gateway in bag and seal. Mail package back to [place] as soon as  ?possible. Your physician should  have your final report approximately 7 days after you have mailed back  ?your monitor. ?Call [facility] at [phone] if you have questions regarding your ZIO AT  ?patch monitor. Call them immediately if you see an orange light blinking on your monitor.  ?If your monitor falls off in less than 4 days, contact our Monitor department at [phone]. If your  ?monitor becomes loose or falls off after 4 days call [employer] at [phone] for suggestions on  ?securing your monitor  ? ? ?Follow-Up: ?At [facility], you and your health needs are our priority.  As part of our continuing mission to provide you with exceptional heart care, we have created designated Provider Care Teams.  These Care Teams include your primary Cardiologist (physician) and Advanced Practice Providers (APPs -  Physician Assistants and Nurse Practitioners) who all work together to provide you with the care you need, when you need it. ? ?We recommend signing up for the patient portal called "MyChart".  Sign up information is provided on this After Visit Summary.  MyChart is used to connect with patients for Virtual Visits (Telemedicine).  Patients are able to view lab/test results, encounter notes, upcoming appointments, etc.  Non-urgent messages can be sent to your provider as well.   ?To learn more about what you can do with MyChart, go to [email or web address].   ? ?Your next appointment:   ?August 2023 ? ?The format for your next appointment:   ?In Person ? ?Provider:   ?Shelva Majestic, MD   ? ? ? ? ?Important Information About Sugar ? ? ? ? ?  ?

## 2021-05-29 NOTE — Progress Notes (Unsigned)
Enrolled patient for a 14 day Zio XT  monitor to be mailed to patients home  °

## 2021-06-02 DIAGNOSIS — R002 Palpitations: Secondary | ICD-10-CM | POA: Diagnosis not present

## 2021-06-05 ENCOUNTER — Ambulatory Visit: Payer: Medicare PPO | Admitting: Cardiovascular Disease

## 2021-06-16 DIAGNOSIS — M0579 Rheumatoid arthritis with rheumatoid factor of multiple sites without organ or systems involvement: Secondary | ICD-10-CM | POA: Diagnosis not present

## 2021-06-24 DIAGNOSIS — I10 Essential (primary) hypertension: Secondary | ICD-10-CM | POA: Diagnosis not present

## 2021-06-24 DIAGNOSIS — R002 Palpitations: Secondary | ICD-10-CM | POA: Diagnosis not present

## 2021-07-07 ENCOUNTER — Telehealth: Payer: Self-pay | Admitting: Cardiovascular Disease

## 2021-07-07 NOTE — Telephone Encounter (Signed)
Patient is calling for the results from heart monitor for 2 weeks. Patient been experiencing afib around 2 or 3 days ago 

## 2021-07-07 NOTE — Telephone Encounter (Signed)
Called patient, advised that I did not have the results yet of the monitor yet but we would call her once reviewed. Patient verbalized understanding. She states she is not sure that it is AFIB (patient does not have this as a history) however she did have palpitations over the weekend. She has not had any issues today, no complaints at this time. I advised patient to continue to monitor, stay hydrated and let us know if it continues.  Patient did mention that things are improving and she has not seen as many issues but just wanted to know what her monitor showed.  I advised once we received the results we would receive recommendations on how to proceed. Patient verbalized understanding, thankful for call back.

## 2021-07-07 NOTE — Telephone Encounter (Signed)
Patient is calling for the results from heart monitor for 2 weeks. Patient been experiencing afib around 2 or 3 days ago

## 2021-07-10 DIAGNOSIS — Z79899 Other long term (current) drug therapy: Secondary | ICD-10-CM | POA: Diagnosis not present

## 2021-07-10 DIAGNOSIS — M0579 Rheumatoid arthritis with rheumatoid factor of multiple sites without organ or systems involvement: Secondary | ICD-10-CM | POA: Diagnosis not present

## 2021-07-10 DIAGNOSIS — M5136 Other intervertebral disc degeneration, lumbar region: Secondary | ICD-10-CM | POA: Diagnosis not present

## 2021-07-10 DIAGNOSIS — E663 Overweight: Secondary | ICD-10-CM | POA: Diagnosis not present

## 2021-07-10 DIAGNOSIS — Z6826 Body mass index (BMI) 26.0-26.9, adult: Secondary | ICD-10-CM | POA: Diagnosis not present

## 2021-07-10 DIAGNOSIS — M1991 Primary osteoarthritis, unspecified site: Secondary | ICD-10-CM | POA: Diagnosis not present

## 2021-07-14 DIAGNOSIS — M0579 Rheumatoid arthritis with rheumatoid factor of multiple sites without organ or systems involvement: Secondary | ICD-10-CM | POA: Diagnosis not present

## 2021-07-17 ENCOUNTER — Telehealth: Payer: Self-pay | Admitting: Cardiovascular Disease

## 2021-07-17 NOTE — Telephone Encounter (Signed)
STAT if patient feels like he/she is going to faint   Are you dizzy now? yes  Do you feel faint or have you passed out? No. Patient is laying on the bed  Do you have any other symptoms? Nausea, palpitations  Have you checked your HR and BP (record if available)?   Patient c/o Palpitations:  High priority if patient c/o lightheadedness, shortness of breath, or chest pain  How long have you had palpitations/irregular HR/ Afib? About two weeks    Are you having the symptoms now? yes  Are you currently experiencing lightheadedness, SOB or CP? Lightheaded   Do you have a history of afib (atrial fibrillation) or irregular heart rhythm? no  Have you checked your BP or HR? (document readings if available): no. Patient has not checked today   Are you experiencing any other symptoms? See above

## 2021-07-17 NOTE — Telephone Encounter (Addendum)
Patient reports dizziness over the past 2 weeks that is increasing. This occurs when she turns her head to the left or when bending; associated with nausea. She denies any ear issues. BP 141/77, P 67. Also, she is asking for results of heart monitor. Appointment made for 6/26 with Dr. Tresa Endo. Reccommended patient not drive; and to be taken to the ED if her symptoms worsen. Patient voiced understanding.

## 2021-07-21 ENCOUNTER — Ambulatory Visit: Payer: Medicare PPO | Admitting: Cardiovascular Disease

## 2021-08-11 DIAGNOSIS — M0579 Rheumatoid arthritis with rheumatoid factor of multiple sites without organ or systems involvement: Secondary | ICD-10-CM | POA: Diagnosis not present

## 2021-08-13 ENCOUNTER — Telehealth: Payer: Self-pay

## 2021-08-13 DIAGNOSIS — R002 Palpitations: Secondary | ICD-10-CM

## 2021-08-13 MED ORDER — METOPROLOL SUCCINATE ER 25 MG PO TB24: 25.0000 mg | ORAL_TABLET | Freq: Every day | ORAL | 0 refills | Status: DC

## 2021-08-13 MED ORDER — METOPROLOL SUCCINATE ER 25 MG PO TB24: 25.0000 mg | ORAL_TABLET | Freq: Every day | ORAL | 4 refills | Status: DC

## 2021-08-13 NOTE — Telephone Encounter (Addendum)
Called patient regarding results. Patient had understanding of results.----- Message from Cannon Kettle, PA-C sent at 08/12/2021  4:24 PM EDT ----- 2 weeks Zio patch shows normal sinus rhythm with intermittent bundle branch block.  Junctional rhythm was detected within 45 seconds of symptomatic patient event.  Competing junctional rhythm was faster than patient's sinus rhythm.  Recommendations: -Start Toprol 25 mg daily -Philipp Callegari please send prescription: Toprol 25 mg daily, #30 with 5 refills. -Make appointment with the EP -Dr. Steffanie Dunn for symptomatic junctional rhythm & intermittent BBB.

## 2021-08-27 ENCOUNTER — Encounter: Payer: Self-pay | Admitting: Cardiology

## 2021-09-06 ENCOUNTER — Other Ambulatory Visit: Payer: Self-pay | Admitting: Physician Assistant

## 2021-09-08 ENCOUNTER — Encounter: Payer: Self-pay | Admitting: Cardiovascular Disease

## 2021-09-08 ENCOUNTER — Ambulatory Visit: Payer: Medicare PPO | Admitting: Cardiovascular Disease

## 2021-09-08 DIAGNOSIS — M0579 Rheumatoid arthritis with rheumatoid factor of multiple sites without organ or systems involvement: Secondary | ICD-10-CM | POA: Diagnosis not present

## 2021-09-08 DIAGNOSIS — R002 Palpitations: Secondary | ICD-10-CM | POA: Diagnosis not present

## 2021-09-08 DIAGNOSIS — M069 Rheumatoid arthritis, unspecified: Secondary | ICD-10-CM

## 2021-09-08 DIAGNOSIS — I10 Essential (primary) hypertension: Secondary | ICD-10-CM

## 2021-09-08 DIAGNOSIS — E785 Hyperlipidemia, unspecified: Secondary | ICD-10-CM

## 2021-09-08 DIAGNOSIS — R5383 Other fatigue: Secondary | ICD-10-CM | POA: Diagnosis not present

## 2021-09-08 DIAGNOSIS — I447 Left bundle-branch block, unspecified: Secondary | ICD-10-CM | POA: Diagnosis not present

## 2021-09-08 DIAGNOSIS — I358 Other nonrheumatic aortic valve disorders: Secondary | ICD-10-CM

## 2021-09-08 DIAGNOSIS — Z111 Encounter for screening for respiratory tuberculosis: Secondary | ICD-10-CM | POA: Diagnosis not present

## 2021-09-08 DIAGNOSIS — Z79899 Other long term (current) drug therapy: Secondary | ICD-10-CM | POA: Diagnosis not present

## 2021-09-08 MED ORDER — METOPROLOL SUCCINATE ER 25 MG PO TB24: 12.5000 mg | ORAL_TABLET | Freq: Every day | ORAL | 3 refills | Status: DC

## 2021-09-08 NOTE — Progress Notes (Unsigned)
Cardiology Office Note    Date:  09/11/2021   ID:  Michelle Castaneda, DOB 09-28-1950, MRN [id number]  PCP:  Lawerance Cruel, MD  Cardiologist:  Shelva Majestic, MD   21-monthfollow-up evaluation  History of Present Illness:  Michelle Bevilacquais a 71y.o. female who who was  referred through the courtesy of Dr. CTamsen Meekfor further evaluation of cardiac murmur and cardiology evaluation. She presents for a one year follow-up evaluation.  [name] a history of rheumatoid arthritis, and is status post a parathyroidectomy and thyroid nodule removal.  She has been found to have a cardiac murmur and admits of being told of having this for a long time.  When I saw her for her initial visit, she did not recall having a previous cardiac echo.  At the time she denied any episodes of chest pain, presyncope or syncope, and awareness of change in ability to walk, PND or orthopnea.  She was exercising regularly and was in a exercise class 4 days per week for 30 minutes and on 2 other days typically walks so that she is exercising at least 6 days per week.  [place] note of Dr. RHarrington Challenger  There is mention that the patient has a bicuspid aortic valve.  The patient is unaware of when this diagnosis was made. Review of laboratory indicates that she has significant hyperlipidemia which is untreated.  In February 2017.  Total cholesterol was 325 with an LDL of 210 and a non-HDL of 220.  HDL was 105.  In May 2018.  Total cholesterol was 300 with an LDL of 186, and HDL remained high at 102 with normal triglycerides at 60.    When I initially saw her, I recommended she undergo a 2D echo Doppler study which was done on July 14, 2016.  This revealed normal systolic function with a EF of 60 to 65%.  There was grade 1 diastolic dysfunction.  Aortic valve appeared structurally normal.  There was no evidence for stenosis.  There was mild aortic insufficiency.   There was trivial tricuspid regurgitation.  She was subsequently seen by BBernerd Pho PDalhartin August 2018.  I saw her in December 2020.,  At that time she was on amlodipine 2.5 mg for hypertension but approximately 6 weeks ago due to elevated blood pressure this dose was increased by Dr. AMelinda Crutchto 5 mg daily.  She iwason rosuvastatin 20 mg for hyperlipidemia.  Recent laboratory on December 16, 2018 showed total cholesterol 190, HDL 91, LDL 90, and triglycerides 45.  She has rheumatoid arthritis and is on Hydrea chloroquine in addition to leflunomide.  She has GERD on omeprazole.  She has a prescription for ibuprofen but rarely takes this.  She states that since she has been on the increased amlodipine her blood pressure has been running in the mid 1694Wsystolically.  She denies any leg swelling.  She denies any chest pain.  She has not been exercising as she had in the past during this COVID-19 pandemic.  She also had undergone low back surgery several months ago at L3-L4 by Dr. HDeri Fuelling  During that evaluation, her blood pressure was elevated and I recommended slight titration of amlodipine to 7.5 mg daily.  I saw her on Jun 11, 2020 and since her prior evaluation she continued to be followed by Dr. RHarrington Challengerfor primary care.  She was on amlodipine 7.5 mg  for hypertension and s hydroxychloroquine and leflunomide for her rheumatoid arthritis.  She continued on rosuvastatin 20 mg for hyperlipidemia and omeprazole for GERD.  During her evaluation, her blood pressure was elevated.  With her previously documented diastolic dysfunction I elected to add at losartan initially 25 mg for 4 days with dose increased to 50 mg.  I recommended she undergo a follow-up echo Doppler study for reassessment of her systolic murmur and systolic and diastolic function.  She underwent an echo Doppler study on July 15, 2020.  EF was 65 to 70%.  Diastolic parameters were indeterminate.  There was mild aortic sclerosis without  stenosis.  I last saw September 23, 2020 feeling well and was walking, riding an exercise bike, and was doing water aerobics approximately 5 days/week.  She denied any chest pain, PND orthopnea, exertional dyspnea or palpitations.  Her laboratory from June 2022 was reviewed.  She continues to be on rosuvastatin total cholesterol 184, triglycerides 46, HDL 95 and LDL 80.  Since I last saw her, she was evaluated by Michelle Presume, PA-C on May 29, 2021.  At that time she was experiencing some heart fluttering sensation which was typically occurring after her morning coffee getting out of the car.  She denied any presyncope.  She continues to exercise regularly and denied any chest pain or shortness of breath.  She continues to have chronic fatigue contributed by her rheumatoid arthritis.  She wore a Zio patch monitor which showed normal sinus rhythm with intermittent bundle branch block.  She also had detection of a junctional rhythm within 45 seconds of asymptomatic patient event.  She was started on Toprol XL 25 mg daily.  Presently, she feels well.  Her palpitations have resolved with beta-blocker therapy.  At times she admits that she may be tired and admits that she does not sleep very well.  Her blood pressure has been stable. She currently is on amlodipine 7.5 mg, losartan 50 mg, and metoprolol succinate 25 mg daily.  She continue be on gabapentin for neuropathy.  She is on rosuvastatin 20 mg for lipid management.  She presents for evaluation.  Past Medical History:  Diagnosis Date   DDD (degenerative disc disease), lumbar    GERD (gastroesophageal reflux disease)    Heart murmur    a. 06/2016: echo showing EF of 60-65% with Grade 1 DD and mild AI.    Hyperlipidemia    Hypertension    Pneumonia    RA (rheumatoid arthritis) ([place])    Spondylolisthesis of lumbar region    Wears dentures    Wears glasses     Past Surgical History:  Procedure Laterality Date   BREAST SURGERY     CERVICAL  POLYPECTOMY N/A 05/30/2015   Procedure: POLYPECTOMY;  Surgeon: Christophe Louis, MD;  Location: [facility];  Service: Gynecology;  Laterality: N/A;   COLONOSCOPY     DIAGNOSTIC LAPAROSCOPY     DILATATION & CURETTAGE/HYSTEROSCOPY WITH MYOSURE N/A 05/30/2015   Procedure: DILATATION & CURETTAGE/HYSTEROSCOPY WITH MYOSURE;  Surgeon: Christophe Louis, MD;  Location: [facility];  Service: Gynecology;  Laterality: N/A;   LAPAROSCOPY FOR ECTOPIC PREGNANCY     MULTIPLE TOOTH EXTRACTIONS     THYROID SURGERY     TUBAL LIGATION      Current Medications: Outpatient Medications Prior to Visit  Medication Sig Dispense Refill   amLODipine (NORVASC) 2.5 MG tablet Take 1 tablet (2.5 mg total) by mouth daily. 90 tablet 3   amLODipine (NORVASC) 5 MG tablet Take 1  tablet (5 mg total) by mouth daily. 90 tablet 3   cyclobenzaprine (FLEXERIL) 5 MG tablet Take 5 mg by mouth daily as needed.     gabapentin (NEURONTIN) 100 MG capsule 100 mg 3 (three) times daily.     gabapentin (NEURONTIN) 300 MG capsule TAKE 1 CAPSULE BY MOUTH EVERYDAY AT BEDTIME     hydroxychloroquine (PLAQUENIL) 200 MG tablet Take 200 mg by mouth 2 (two) times daily.     ibuprofen (ADVIL,MOTRIN) 800 MG tablet Take 1 tablet (800 mg total) by mouth every 8 (eight) hours as needed for mild pain or moderate pain. 30 tablet 1   losartan (COZAAR) 50 MG tablet Take 1 tablet (50 mg total) by mouth daily. 90 tablet 2   naphazoline-pheniramine (VISINE) 0.025-0.3 % ophthalmic solution 1 drop into both eyes as needed for itching     omeprazole (PRILOSEC) 20 MG capsule Take 1 capsule (20 mg total) by mouth daily. 30 capsule 0   Polyethyl Glycol-Propyl Glycol (SYSTANE OP) Place 1 drop into both eyes daily as needed (itching).     rosuvastatin (CRESTOR) 20 MG tablet Take 1 tablet (20 mg total) by mouth daily. TAKE 1 TABLET (20 MG TOTAL) BY MOUTH DAILY. 90 tablet 2   metoprolol succinate (TOPROL XL) 25 MG 24 hr tablet Take 1 tablet (25 mg total) by mouth daily. 30 tablet 4   certolizumab  pegol (CIMZIA) 2 X 200 MG KIT 459m     Multiple Vitamin (MULTIVITAMIN WITH MINERALS) TABS tablet Take 1 tablet by mouth every other day. (Patient not taking: Reported on 09/08/2021)     metoprolol succinate (TOPROL XL) 25 MG 24 hr tablet Take 1 tablet (25 mg total) by mouth daily. 30 tablet 0   No facility-administered medications prior to visit.     Allergies:   Aspirin, Iodine, Naprosyn [naproxen], and Tramadol   Social History   Socioeconomic History   Marital status: Married    Spouse name: Not on file   Number of children: Not on file   Years of education: Not on file   Highest education level: Not on file  Occupational History   Not on file  Tobacco Use   Smoking status: Former    Types: Cigarettes   Smokeless tobacco: Never  Vaping Use   Vaping Use: Never used  Substance and Sexual Activity   Alcohol use: Yes    Comment: occ   Drug use: No   Sexual activity: Not on file  Other Topics Concern   Not on file  Social History Narrative   Not on file   Social Determinants of Health   Financial Resource Strain: Not on file  Food Insecurity: Not on file  Transportation Needs: Not on file  Physical Activity: Not on file  Stress: Not on file  Social Connections: Not on file    Additional social history is that she is married for 49 years.  She has 3 sons, 5 grandchildren, and 2 great-grandchildren.  I take care of he patient's husband who recently had valve surgery  Family History:  The patient's family history includes Alcoholism in her father; Diabetes in her brother, father, and paternal grandmother; Diabetes Mellitus II in her mother; Heart disease in her brother; Lung cancer in her maternal grandfather.   Her mother died at age 689with heart failure.  She has 4 deceased brothers and 3 brothers are alive.  ROS General: Negative; No fevers, chills, or night sweats;  HEENT: Negative; No changes in vision or  hearing, sinus congestion, difficulty swallowing Pulmonary:  Negative; No cough, wheezing, shortness of breath, hemoptysis Cardiovascular: See history of present illness GI: Negative; No nausea, vomiting, diarrhea, or abdominal pain GU: Negative; No dysuria, hematuria, or difficulty voiding Musculoskeletal: Status post recent L3-L4 back surgery Hematologic/Oncology: Negative; no easy bruising, bleeding Endocrine: Negative; no heat/cold intolerance; no diabetes Neuro: Negative; no changes in balance, headaches Skin: Negative; No rashes or skin lesions Psychiatric: Negative; No behavioral problems, depression Sleep: Negative; No snoring, daytime sleepiness, hypersomnolence, bruxism, restless legs, hypnogognic hallucinations, no cataplexy Other comprehensive 14 point system review is negative.   PHYSICAL EXAM:   VS:  BP 138/72 (BP Location: Left Arm, Patient Position: Sitting, Cuff Size: Normal)   Pulse (!) 49   Ht 5' 6"  (1.676 m)   Wt 167 lb (75.8 kg)   SpO2 95%   BMI 26.95 kg/m     Repeat blood pressure by me was 122/72  Wt Readings from Last 3 Encounters:  09/08/21 167 lb (75.8 kg)  05/29/21 175 lb (79.4 kg)  10/17/20 167 lb 9.6 oz (76 kg)    General: Alert, oriented, no distress.  Skin: normal turgor, no rashes, warm and dry HEENT: Normocephalic, atraumatic. Pupils equal round and reactive to light; sclera anicteric; extraocular muscles intact; Nose without nasal septal hypertrophy Mouth/Parynx benign; Mallinpatti scale 3 Neck: No JVD, no carotid bruits; normal carotid upstroke Lungs: clear to ausculatation and percussion; no wheezing or rales Chest wall: without tenderness to palpitation Heart: PMI not displaced, RRR, s1 s2 normal, 1/6 systolic murmur at left sternal border and 2/6 systolic murmur at apex., no diastolic murmur, no rubs, gallops, thrills, or heaves Abdomen: soft, nontender; no hepatosplenomehaly, BS+; abdominal aorta nontender and not dilated by palpation. Back: no CVA tenderness Pulses 2+ Musculoskeletal: full range  of motion, normal strength, no joint deformities Extremities: no clubbing cyanosis or edema, Homan's sign negative  Neurologic: grossly nonfocal; Cranial nerves grossly wnl Psychologic: Normal mood and affect    Studies/Labs Reviewed:   September 08, 2021 ECG (independently read by me): Sinus bradycardia at 49, LBBB  September 23, 2020 ECG (independently read by me): NSR at 65; LAD, LVH by voltage; no ectopy, normal intervals    Jun 11, 2020 ECG (independently read by me): NSR at 72; LVH by voltage; QTc 468 msec  December 2020 ECG (independently read by me): Normal sinus rhythm at 63 bpm, LVH by voltage.  May 2018 ECG (independently read by me): normal sinus rhythm at 78 bpm.  Mild LVH by voltage criteria in aVL.  Normal intervals.  Recent Labs:    Latest Ref Rng & Units 07/08/2020    8:20 AM 07/21/2018    8:48 AM 09/14/2016    9:18 AM  BMP  Glucose 65 - 99 mg/dL 99  105  118   BUN 8 - 27 mg/dL 16  11  20    Creatinine 0.57 - 1.00 mg/dL 0.57  0.60  0.74   BUN/Creat Ratio 12 - 28 28   27    Sodium 134 - 144 mmol/L 145  140  142   Potassium 3.5 - 5.2 mmol/L 4.0  4.0  5.2   Chloride 96 - 106 mmol/L 109  108  106   CO2 20 - 29 mmol/L 22  26  24    Calcium 8.7 - 10.3 mg/dL 9.5  9.9  10.4         Latest Ref Rng & Units 07/08/2020    8:20 AM 09/14/2016    9:18 AM  Hepatic Function  Total Protein 6.0 - 8.5 g/dL 6.1  6.7   Albumin 3.7 - 4.7 g/dL 4.3  4.4   AST 0 - 40 IU/L 23  28   ALT 0 - 32 IU/L 20  30   Alk Phosphatase 44 - 121 IU/L 105  105   Total Bilirubin 0.0 - 1.2 mg/dL 0.2  0.3        Latest Ref Rng & Units 07/08/2020    8:20 AM 07/21/2018    8:48 AM 07/03/2016   11:54 PM  CBC  WBC 3.4 - 10.8 x10E3/uL 3.5  3.2  4.7   Hemoglobin 11.1 - 15.9 g/dL 12.7  13.2  12.6   Hematocrit 34.0 - 46.6 % 38.4  42.5  38.6   Platelets 150 - 450 x10E3/uL 158  140  158    Lab Results  Component Value Date   MCV 90 07/08/2020   MCV 93.4 07/21/2018   MCV 88.9 07/03/2016   Lab Results   Component Value Date   TSH 0.777 07/08/2020   No results found for: "HGBA1C"   BNP No results found for: "BNP"  ProBNP No results found for: "PROBNP"   Lipid Panel     Component Value Date/Time   CHOL 184 07/08/2020 0820   TRIG 46 07/08/2020 0820   HDL 95 07/08/2020 0820   CHOLHDL 1.9 07/08/2020 0820   LDLCALC 80 07/08/2020 0820     RADIOLOGY: No results found.   Additional studies/ records that were reviewed today include:  I reviewed the patient's records from [place] [facility]  Echo Doppler from June 2018 was reviewed  ECHO: 07/15/2020 IMPRESSIONS   1. Left ventricular ejection fraction, by estimation, is 65 to 70%. The  left ventricle has normal function. The left ventricle has no regional  wall motion abnormalities. Left ventricular diastolic parameters are  indeterminate.   2. Right ventricular systolic function is normal. The right ventricular  size is normal. There is normal pulmonary artery systolic pressure.   3. The mitral valve is normal in structure. Trivial mitral valve  regurgitation. No evidence of mitral stenosis.   4. The aortic valve is tricuspid. Aortic valve regurgitation is mild.  Mild aortic valve sclerosis is present, with no evidence of aortic valve  stenosis.   5. The inferior vena cava is normal in size with greater than 50%  respiratory variability, suggesting right atrial pressure of 3 mmHg.   Comparison(s): No significant change from prior study.   Conclusion(s)/Recommendation(s): Otherwise normal echocardiogram, with  minor abnormalities described in the report.   ASSESSMENT:    1. Palpitations   2. Essential hypertension   3. Aortic valve sclerosis   4. Hyperlipidemia LDL goal <100   5. Left bundle branch block   6. Rheumatoid arthritis, involving unspecified site, unspecified whether rheumatoid factor present [facility])      PLAN:  Ms. Elliona Doddridge is a 71 year-old female who admits to a long-standing history of a cardiac  murmur.  In the [facility] records, there was mention of a possible bicuspid aortic valve.  I reviewed her 2D echo Doppler study from June 2018 which showed a structurally normal aortic valve.  There was no mention of a bicuspid valve.  She had mild aortic insufficiency.  There was no evidence for aortic valve stenosis or significant sclerosis.  She also had trace MR.  LV function was normal with EF estimated 60 to 65% with grade 1 diastolic dysfunction.  Over the last several years she required  medication titration for more optimal blood pressure control.  When I saw her in May 2022 her blood pressure was elevated on amlodipine 7.5 mg.  With her previous demonstration of mild diastolic dysfunction and mild blood pressure elevation at that time I recommended initiation of ARB therapy with losartan and she has been titrated to 50 mg daily.  Afollow-up echo Doppler study on July 23, 2020 continue to show hyperdynamic LV function without wall motion abnormalities.  There was evidence for mild aortic sclerosis without stenosis.  She had normal estimated RV systolic pressure.  Presently, she is on a regimen of amlodipine 7.5 mg, metoprolol succinate 25 mg and losartan 50 mg daily for hypertension.  Her blood pressure on recheck by me was excellent at 122/72.  Since my last evaluation I reviewed the office visit from Michelle Presume, PA-C on May 29, 2021.  At that time, she had noticed some heart fluttering sensation which seem to be more prominent after drinking her morning coffee and getting out of the car.  She was started on metoprolol succinate 25 mg daily.  Her prior symptomatology has improved but she admits to increased fatigability with her current dosing.  Her resting pulse today is 49.  I have suggested she reduce her metoprolol succinate dose from 25 mg down to 12.5 mg daily.  She continues to be on rosuvastatin 20 mg daily for hyperlipidemia.  She will be undergoing follow-up laboratory with Dr. Mardene Sayer  her primary physician.  She apparently already has a follow-up appointment scheduled to see me in October.  She will contact me in the interim if problems arise.    Medication Adjustments/Labs and Tests Ordered: Current medicines are reviewed at length with the patient today.  Concerns regarding medicines are outlined above.  Medication changes, Labs and Tests ordered today are listed in the Patient Instructions below. Patient Instructions  Medication Instructions:  DECREASE metoprolol succinate (Toprol XL) to 12.5 mg daily --ok to take additonal 12.5 mg AS NEEDED for palpitations  *If you need a refill on your cardiac medications before your next appointment, please call your pharmacy*  Follow-Up: At [facility], you and your health needs are our priority.  As part of our continuing mission to provide you with exceptional heart care, we have created designated Provider Care Teams.  These Care Teams include your primary Cardiologist (physician) and Advanced Practice Providers (APPs -  Physician Assistants and Nurse Practitioners) who all work together to provide you with the care you need, when you need it.  We recommend signing up for the patient portal called "MyChart".  Sign up information is provided on this After Visit Summary.  MyChart is used to connect with patients for Virtual Visits (Telemedicine).  Patients are able to view lab/test results, encounter notes, upcoming appointments, etc.  Non-urgent messages can be sent to your provider as well.   To learn more about what you can do with MyChart, go to [email or web address].    Your next appointment:   As scheduled with Dr. Claiborne Billings   Important Information About Sugar         Signed, Shelva Majestic, MD  09/11/2021 2:25 PM    [facility] [address] [place], [place], [place]  [address] Phone: [phone]

## 2021-09-08 NOTE — Patient Instructions (Signed)
Medication Instructions:  DECREASE metoprolol succinate (Toprol XL) to 12.5 mg daily --ok to take additonal 12.5 mg AS NEEDED for palpitations  *If you need a refill on your cardiac medications before your next appointment, please call your pharmacy*  Follow-Up: At [facility], you and your health needs are our priority.  As part of our continuing mission to provide you with exceptional heart care, we have created designated Provider Care Teams.  These Care Teams include your primary Cardiologist (physician) and Advanced Practice Providers (APPs -  Physician Assistants and Nurse Practitioners) who all work together to provide you with the care you need, when you need it.  We recommend signing up for the patient portal called "MyChart".  Sign up information is provided on this After Visit Summary.  MyChart is used to connect with patients for Virtual Visits (Telemedicine).  Patients are able to view lab/test results, encounter notes, upcoming appointments, etc.  Non-urgent messages can be sent to your provider as well.   To learn more about what you can do with MyChart, go to [email or web address].    Your next appointment:   As scheduled with Dr. Tresa Endo   Important Information About Sugar

## 2021-09-11 ENCOUNTER — Encounter: Payer: Self-pay | Admitting: Cardiovascular Disease

## 2021-09-25 ENCOUNTER — Other Ambulatory Visit: Payer: Self-pay

## 2021-09-25 ENCOUNTER — Telehealth: Payer: Self-pay | Admitting: Cardiovascular Disease

## 2021-09-25 MED ORDER — AMLODIPINE BESYLATE 2.5 MG PO TABS: ORAL_TABLET | ORAL | 3 refills | Status: DC

## 2021-09-25 NOTE — Telephone Encounter (Signed)
Pt c/o medication issue:  1. Name of Medication: amLODipine (NORVASC) 5 MG tablet  2. How are you currently taking this medication (dosage and times per day)? 1 and a half tablets daily  3. Are you having a reaction (difficulty breathing--STAT)? no  4. What is your medication issue? Patient states she takes 1 and a half tablets, but the tablets are very small and hard to cut. She would like a 2.5 mg tablet sent to [facility]. She says the pharmacy also sent a request.

## 2021-09-25 NOTE — Telephone Encounter (Signed)
Patient stated it is difficult to cut amlodipine 5mg  tablet. She requested prescription for 2.5 mg tablet to add to her 5mg  tablet to total amlodipine 7.5 mg daily. BP today 136/64. Refill for amlodipine 2.5mg  sent to [facility] pharmacy.

## 2021-10-06 DIAGNOSIS — M0579 Rheumatoid arthritis with rheumatoid factor of multiple sites without organ or systems involvement: Secondary | ICD-10-CM | POA: Diagnosis not present

## 2021-10-22 ENCOUNTER — Other Ambulatory Visit: Payer: Self-pay

## 2021-10-22 ENCOUNTER — Telehealth: Payer: Self-pay | Admitting: Cardiovascular Disease

## 2021-10-22 MED ORDER — AMLODIPINE BESYLATE 5 MG PO TABS: 5.0000 mg | ORAL_TABLET | Freq: Every day | ORAL | 3 refills | Status: DC

## 2021-10-22 NOTE — Telephone Encounter (Signed)
RX updated and sent to pharmacy  Thanks!

## 2021-10-22 NOTE — Telephone Encounter (Signed)
Pt c/o medication issue:  1. Name of Medication: amLODipine (NORVASC) 5 MG tablet (Expired)  2. How are you currently taking this medication (dosage and times per day)? Taking with 2.5 mg daily to have total of 7.5 mg.   3. Are you having a reaction (difficulty breathing--STAT)? No  4. What is your medication issue? Pharmacy is requesting this prescription be updated to show instructions to take one daily with 2.5 mg amLODipine.     [place]

## 2021-10-30 NOTE — Progress Notes (Deleted)
Electrophysiology Office Note:    Date:  10/30/2021   ID:  Michelle Castaneda, DOB 01-07-1951, MRN [id number]  PCP:  Lawerance Cruel, MD  [facility] Cardiologist:  Shelva Majestic, MD  [facility] Electrophysiologist:  Vickie Epley, MD   Referring MD: [place] Sine, MD   Chief Complaint: Palpitations  History of Present Illness:    Michelle Castaneda is a 71 y.o. female who presents for an evaluation of palpitations at the request of Dr. Claiborne Billings. Their medical history includes rheumatoid arthritis, murmur.  The patient saw Dr. Claiborne Billings September 08, 2021.  This was after she saw [name], PA-C on May 29, 2021.  The patient had a prior heart monitor for palpitations which showed an intermittent junctional rhythm.  Metoprolol succinate 25 mg by mouth once daily was started and this helped her palpitations.     Past Medical History:  Diagnosis Date   DDD (degenerative disc disease), lumbar    GERD (gastroesophageal reflux disease)    Heart murmur    a. 06/2016: echo showing EF of 60-65% with Grade 1 DD and mild AI.    Hyperlipidemia    Hypertension    Pneumonia    RA (rheumatoid arthritis) ([place])    Spondylolisthesis of lumbar region    Wears dentures    Wears glasses     Past Surgical History:  Procedure Laterality Date   BREAST SURGERY     CERVICAL POLYPECTOMY N/A 05/30/2015   Procedure: POLYPECTOMY;  Surgeon: Christophe Louis, MD;  Location: [place] [facility];  Service: Gynecology;  Laterality: N/A;   COLONOSCOPY     DIAGNOSTIC LAPAROSCOPY     DILATATION & CURETTAGE/HYSTEROSCOPY WITH MYOSURE N/A 05/30/2015   Procedure: DILATATION & CURETTAGE/HYSTEROSCOPY WITH MYOSURE;  Surgeon: Christophe Louis, MD;  Location: [place] [facility];  Service: Gynecology;  Laterality: N/A;   LAPAROSCOPY FOR ECTOPIC PREGNANCY     MULTIPLE TOOTH EXTRACTIONS     THYROID SURGERY     TUBAL LIGATION      Current Medications: No outpatient medications have been marked as taking for the 10/31/21 encounter (Appointment) with  Vickie Epley, MD.     Allergies:   Aspirin, Iodine, Naprosyn [naproxen], and Tramadol   Social History   Socioeconomic History   Marital status: Married    Spouse name: Not on file   Number of children: Not on file   Years of education: Not on file   Highest education level: Not on file  Occupational History   Not on file  Tobacco Use   Smoking status: Former    Types: Cigarettes   Smokeless tobacco: Never  Vaping Use   Vaping Use: Never used  Substance and Sexual Activity   Alcohol use: Yes    Comment: occ   Drug use: No   Sexual activity: Not on file  Other Topics Concern   Not on file  Social History Narrative   Not on file   Social Determinants of Health   Financial Resource Strain: Not on file  Food Insecurity: Not on file  Transportation Needs: Not on file  Physical Activity: Not on file  Stress: Not on file  Social Connections: Not on file     Family History: The patient's family history includes Alcoholism in her father; Diabetes in her brother, father, and paternal grandmother; Diabetes Mellitus II in her mother; Heart disease in her brother; Lung cancer in her maternal grandfather. There is no history of Breast cancer.  ROS:   Please see the  history of present illness.    All other systems reviewed and are negative.  EKGs/Labs/Other Studies Reviewed:    The following studies were reviewed today:  August 02, 2021 ZIO monitor Patient had a min HR of 46 bpm, max HR of 184 bpm, and avg HR of 68 bpm. Predominant underlying rhythm was Sinus Rhythm. Slight P wave morphology changes were noted. Intermittent Bundle Branch Block was present. QRS morphology changes were present  throughout recording. 5 Supraventricular Tachycardia runs occurred, the run with the fastest interval lasting 8 beats with a max rate of 184 bpm, the longest lasting 16 beats with an avg rate of 113 bpm. Junctional Rhythm was present. Junctional Rhythm  was detected within +/- 45  seconds of symptomatic patient event(s). Isolated SVEs were rare (<1.0%), SVE Couplets were rare (<1.0%), and SVE Triplets were rare (<1.0%). Isolated VEs were rare (<1.0%, 579), VE Couplets were rare (<1.0%, 97), and no VE  Triplets were present.   Predominant sinus rhythm with intermittent episodes of bundle branch block.  There were several episodes of patient triggered junctional rhythm.  5 short-lived episodes of SVT with the fastest interval with a maximum rate of 184 lasting 8 beats and the longest lasting 16 beats with an average rate of 113 bpm.  Rare isolated PACs and PVCs; no evidence for VT.  EKG:  The ekg ordered today demonstrates ***   Recent Labs: No results found for requested labs within last 365 days.  Recent Lipid Panel    Component Value Date/Time   CHOL 184 07/08/2020 0820   TRIG 46 07/08/2020 0820   HDL 95 07/08/2020 0820   CHOLHDL 1.9 07/08/2020 0820   LDLCALC 80 07/08/2020 0820    Physical Exam:    VS:  There were no vitals taken for this visit.    Wt Readings from Last 3 Encounters:  09/08/21 167 lb (75.8 kg)  05/29/21 175 lb (79.4 kg)  10/17/20 167 lb 9.6 oz (76 kg)     GEN: *** Well nourished, well developed in no acute distress HEENT: Normal NECK: No JVD; No carotid bruits LYMPHATICS: No lymphadenopathy CARDIAC: ***RRR, no murmurs, rubs, gallops RESPIRATORY:  Clear to auscultation without rales, wheezing or rhonchi  ABDOMEN: Soft, non-tender, non-distended MUSCULOSKELETAL:  No edema; No deformity  SKIN: Warm and dry NEUROLOGIC:  Alert and oriented x 3 PSYCHIATRIC:  Normal affect       ASSESSMENT:    1. Palpitations   2. Primary hypertension   3. Left bundle branch block    PLAN:    In order of problems listed above:  #Palpitations Improved on beta-blocker Continue Toprol-XL  #Hypertension *** goal today.  Recommend checking blood pressures 1-2 times per week at home and recording the values.  Recommend bringing these recordings  to the primary care physician.         Total time spent with patient today *** minutes. This includes reviewing records, evaluating the patient and coordinating care.  Medication Adjustments/Labs and Tests Ordered: Current medicines are reviewed at length with the patient today.  Concerns regarding medicines are outlined above.  No orders of the defined types were placed in this encounter.  No orders of the defined types were placed in this encounter.    Signed, Hilton Cork. Quentin Ore, MD, [facility], [facility] 10/30/2021 9:30 PM    Electrophysiology [facility]

## 2021-10-31 ENCOUNTER — Ambulatory Visit: Payer: Medicare PPO | Attending: Cardiology | Admitting: Cardiology

## 2021-10-31 ENCOUNTER — Encounter: Payer: Self-pay | Admitting: Cardiology

## 2021-10-31 VITALS — BP 112/60 | HR 52 | Ht 66.0 in | Wt 168.6 lb

## 2021-10-31 DIAGNOSIS — I447 Left bundle-branch block, unspecified: Secondary | ICD-10-CM | POA: Diagnosis not present

## 2021-10-31 DIAGNOSIS — R002 Palpitations: Secondary | ICD-10-CM

## 2021-10-31 DIAGNOSIS — I10 Essential (primary) hypertension: Secondary | ICD-10-CM | POA: Diagnosis not present

## 2021-10-31 NOTE — Progress Notes (Signed)
Electrophysiology Office Note:    Date:  10/31/2021   ID:  Michelle Castaneda, DOB 22-Jun-1950, MRN [id number]  PCP:  Lawerance Cruel, MD  [facility] Cardiologist:  Shelva Majestic, MD  [facility] Electrophysiologist:  Vickie Epley, MD   Referring MD: [place] Sine, MD   Chief Complaint: Palpitations  History of Present Illness:    Michelle Castaneda is a 71 y.o. female who presents for an evaluation of palpitations at the request of Dr. Claiborne Billings. Their medical history includes rheumatoid arthritis, murmur.  The patient saw Dr. Claiborne Billings September 08, 2021.  This was after she saw [name], PA-C on May 29, 2021.  The patient had a prior heart monitor for palpitations which showed an intermittent junctional rhythm.  Metoprolol succinate 25 mg by mouth once daily was started and this helped her palpitations.  Patient states that she continues to be tired. However, her palpitations have improved since starting the Toprol XL. She denies any syncope.  She reports some prior episodes of room-spinning dizziness when bending forward. She suspects that this was likely vertigo but she was not evaluated as it resolved on its own.  She walks 1.5-2 miles daily on the [facility] track and she rides a stationary bike for 3 miles and swims several days a week.     Past Medical History:  Diagnosis Date   DDD (degenerative disc disease), lumbar    GERD (gastroesophageal reflux disease)    Heart murmur    a. 06/2016: echo showing EF of 60-65% with Grade 1 DD and mild AI.    Hyperlipidemia    Hypertension    Pneumonia    RA (rheumatoid arthritis) ([place])    Spondylolisthesis of lumbar region    Wears dentures    Wears glasses     Past Surgical History:  Procedure Laterality Date   BREAST SURGERY     CERVICAL POLYPECTOMY N/A 05/30/2015   Procedure: POLYPECTOMY;  Surgeon: Christophe Louis, MD;  Location: [place] [facility];  Service: Gynecology;  Laterality: N/A;   COLONOSCOPY     DIAGNOSTIC LAPAROSCOPY      DILATATION & CURETTAGE/HYSTEROSCOPY WITH MYOSURE N/A 05/30/2015   Procedure: DILATATION & CURETTAGE/HYSTEROSCOPY WITH MYOSURE;  Surgeon: Christophe Louis, MD;  Location: [place] [facility];  Service: Gynecology;  Laterality: N/A;   LAPAROSCOPY FOR ECTOPIC PREGNANCY     MULTIPLE TOOTH EXTRACTIONS     THYROID SURGERY     TUBAL LIGATION      Current Medications: Current Meds  Medication Sig   amLODipine (NORVASC) 2.5 MG tablet Take one tablet along with your amlodipine 34m tablet to total 7.510mdaily.   amLODipine (NORVASC) 5 MG tablet Take 1 tablet (5 mg total) by mouth daily. Take along with 2.5 mg = 7.5 mg   certolizumab pegol (CIMZIA) 2 X 200 MG KIT 40055m cyclobenzaprine (FLEXERIL) 5 MG tablet Take 5 mg by mouth daily as needed.   gabapentin (NEURONTIN) 100 MG capsule 100 mg 3 (three) times daily.   gabapentin (NEURONTIN) 300 MG capsule TAKE 1 CAPSULE BY MOUTH EVERYDAY AT BEDTIME   hydroxychloroquine (PLAQUENIL) 200 MG tablet Take 200 mg by mouth 2 (two) times daily.   ibuprofen (ADVIL,MOTRIN) 800 MG tablet Take 1 tablet (800 mg total) by mouth every 8 (eight) hours as needed for mild pain or moderate pain.   losartan (COZAAR) 50 MG tablet Take 1 tablet (50 mg total) by mouth daily.   metoprolol succinate (TOPROL XL) 25 MG 24 hr tablet Take 0.5  tablets (12.5 mg total) by mouth daily. Ok to take additional 12.5 mg AS NEEDED for palpitations   Multiple Vitamin (MULTIVITAMIN WITH MINERALS) TABS tablet Take 1 tablet by mouth every other day.   naphazoline-pheniramine (VISINE) 0.025-0.3 % ophthalmic solution 1 drop into both eyes as needed for itching   omeprazole (PRILOSEC) 20 MG capsule Take 1 capsule (20 mg total) by mouth daily.   Polyethyl Glycol-Propyl Glycol (SYSTANE OP) Place 1 drop into both eyes daily as needed (itching).     Allergies:   Aspirin, Iodine, Naprosyn [naproxen], and Tramadol   Social History   Socioeconomic History   Marital status: Married    Spouse name: Not on file   Number of  children: Not on file   Years of education: Not on file   Highest education level: Not on file  Occupational History   Not on file  Tobacco Use   Smoking status: Former    Types: Cigarettes   Smokeless tobacco: Never  Vaping Use   Vaping Use: Never used  Substance and Sexual Activity   Alcohol use: Yes    Comment: occ   Drug use: No   Sexual activity: Not on file  Other Topics Concern   Not on file  Social History Narrative   Not on file   Social Determinants of Health   Financial Resource Strain: Not on file  Food Insecurity: Not on file  Transportation Needs: Not on file  Physical Activity: Not on file  Stress: Not on file  Social Connections: Not on file     Family History: The patient's family history includes Alcoholism in her father; Diabetes in her brother, father, and paternal grandmother; Diabetes Mellitus II in her mother; Heart disease in her brother; Lung cancer in her maternal grandfather. There is no history of Breast cancer.  ROS:   Please see the history of present illness.    (+) fatigue All other systems reviewed and are negative.  EKGs/Labs/Other Studies Reviewed:    The following studies were reviewed today:  August 02, 2021 ZIO monitor Patient had a min HR of 46 bpm, max HR of 184 bpm, and avg HR of 68 bpm. Predominant underlying rhythm was Sinus Rhythm. Slight P wave morphology changes were noted. Intermittent Bundle Branch Block was present. QRS morphology changes were present  throughout recording. 5 Supraventricular Tachycardia runs occurred, the run with the fastest interval lasting 8 beats with a max rate of 184 bpm, the longest lasting 16 beats with an avg rate of 113 bpm. Junctional Rhythm was present. Junctional Rhythm  was detected within +/- 45 seconds of symptomatic patient event(s). Isolated SVEs were rare (<1.0%), SVE Couplets were rare (<1.0%), and SVE Triplets were rare (<1.0%). Isolated VEs were rare (<1.0%, 579), VE Couplets were rare  (<1.0%, 97), and no VE  Triplets were present.   Predominant sinus rhythm with intermittent episodes of bundle branch block.  There were several episodes of patient triggered junctional rhythm.  5 short-lived episodes of SVT with the fastest interval with a maximum rate of 184 lasting 8 beats and the longest lasting 16 beats with an average rate of 113 bpm.  Rare isolated PACs and PVCs; no evidence for VT.  EKG:  The ekg ordered today demonstrates sinus rhythm with a nonspecific interventricular conduction delay (left bundle like)   Recent Labs: No results found for requested labs within last 365 days.  Recent Lipid Panel    Component Value Date/Time   CHOL 184 07/08/2020 0820  TRIG 46 07/08/2020 0820   HDL 95 07/08/2020 0820   CHOLHDL 1.9 07/08/2020 0820   LDLCALC 80 07/08/2020 0820    Physical Exam:    VS:  BP 112/60   Pulse (!) 52   Ht 5' 6"  (1.676 m)   Wt 168 lb 9.6 oz (76.5 kg)   SpO2 98%   BMI 27.21 kg/m     Wt Readings from Last 3 Encounters:  10/31/21 168 lb 9.6 oz (76.5 kg)  09/08/21 167 lb (75.8 kg)  05/29/21 175 lb (79.4 kg)     GEN: Well nourished, well developed in no acute distress HEENT: Normal NECK: No JVD; No carotid bruits LYMPHATICS: No lymphadenopathy CARDIAC: RRR, no murmurs, rubs, gallops RESPIRATORY:  Clear to auscultation without rales, wheezing or rhonchi  ABDOMEN: Soft, non-tender, non-distended MUSCULOSKELETAL:  No edema; No deformity  SKIN: Warm and dry NEUROLOGIC:  Alert and oriented x 3 PSYCHIATRIC:  Normal affect       ASSESSMENT:    1. Palpitations   2. Primary hypertension   3. Left bundle branch block    PLAN:    In order of problems listed above:  #Palpitations Improved on beta-blocker Continue Toprol-XL  #Hypertension At goal today.  Recommend checking blood pressures 1-2 times per week at home and recording the values.  Recommend bringing these recordings to the primary care physician.   Follow up as  needed.     Medication Adjustments/Labs and Tests Ordered: Current medicines are reviewed at length with the patient today.  Concerns regarding medicines are outlined above.  No orders of the defined types were placed in this encounter.  No orders of the defined types were placed in this encounter.  This document serves as a record of services personally performed by Lars Mage, MD. It was created on her behalf by Eugene Gavia, a trained medical scribe. The creation of this record is based on the scribe's personal observations and the provider's statements to them. This document has been checked and approved by the attending provider.  I, Vickie Epley, MD, have reviewed all documentation for this visit. The documentation on 10/31/21 for the exam, diagnosis, procedures, and orders are all accurate and complete.    Signed, Hilton Cork. Quentin Ore, MD, [facility], [facility] 10/31/2021 7:17 PM    Electrophysiology [facility]

## 2021-11-03 DIAGNOSIS — Z79899 Other long term (current) drug therapy: Secondary | ICD-10-CM | POA: Diagnosis not present

## 2021-11-03 DIAGNOSIS — M0579 Rheumatoid arthritis with rheumatoid factor of multiple sites without organ or systems involvement: Secondary | ICD-10-CM | POA: Diagnosis not present

## 2021-11-06 ENCOUNTER — Ambulatory Visit: Payer: Medicare PPO | Attending: Cardiovascular Disease | Admitting: Cardiovascular Disease

## 2021-11-06 ENCOUNTER — Encounter: Payer: Self-pay | Admitting: Cardiovascular Disease

## 2021-11-06 DIAGNOSIS — E7800 Pure hypercholesterolemia, unspecified: Secondary | ICD-10-CM

## 2021-11-06 DIAGNOSIS — I358 Other nonrheumatic aortic valve disorders: Secondary | ICD-10-CM | POA: Diagnosis not present

## 2021-11-06 DIAGNOSIS — I447 Left bundle-branch block, unspecified: Secondary | ICD-10-CM | POA: Diagnosis not present

## 2021-11-06 DIAGNOSIS — I10 Essential (primary) hypertension: Secondary | ICD-10-CM | POA: Diagnosis not present

## 2021-11-06 NOTE — Progress Notes (Signed)
Cardiology Office Note    Date:  11/06/2021   ID:  Mirta Mally, DOB [birth year], MRN [id number]  PCP:  Lawerance Cruel, MD  Cardiologist:  Shelva Majestic, MD   2 -month follow-up evaluation  History of Present Illness:  Michelle Castaneda is a 71 y.o. female who who was  referred through the courtesy of Dr. Tamsen Meek for evaluation of cardiac murmur and cardiology evaluation.  She was last seen September 08, 2021 presents for follow-up evaluation.  Michelle Castaneda has a history of rheumatoid arthritis, and is status post a parathyroidectomy and thyroid nodule removal.  She has been found to have a cardiac murmur and admits of being told of having this for a long time.  When I saw her for her initial visit, she did not recall having a previous cardiac echo.  At the time she denied any episodes of chest pain, presyncope or syncope, and awareness of change in ability to walk, PND or orthopnea.  She was exercising regularly and was in a exercise class 4 days per week for 30 minutes and on 2 other days typically walks so that she is exercising at least 6 days per week.  I have reviewed records from [facility] and in the note of Dr. Harrington Challenger.  There is mention that the patient has a bicuspid aortic valve.  The patient is unaware of when this diagnosis was made. Review of laboratory indicates that she has significant hyperlipidemia which is untreated.  In February 2017.  Total cholesterol was 325 with an LDL of 210 and a non-HDL of 220.  HDL was 105.  In May 2018.  Total cholesterol was 300 with an LDL of 186, and HDL remained high at 102 with normal triglycerides at 60.    When I initially saw her, I recommended she undergo a 2D echo Doppler study which was done on July 14, 2016.  This revealed normal systolic function with a EF of 60 to 65%.  There was grade 1 diastolic dysfunction.  Aortic valve appeared structurally normal.  There was no evidence for stenosis.  There was mild  aortic insufficiency.  There was trivial tricuspid regurgitation.  She was subsequently seen by Bernerd Pho, [place] in August 2018.  I saw her in December 2020.,  At that time she was on amlodipine 2.5 mg for hypertension but approximately 6 weeks ago due to elevated blood pressure this dose was increased by Dr. Melinda Crutch to 5 mg daily.  She iwason rosuvastatin 20 mg for hyperlipidemia.  Recent laboratory on December 16, 2018 showed total cholesterol 190, HDL 91, LDL 90, and triglycerides 45.  She has rheumatoid arthritis and is on Hydrea chloroquine in addition to leflunomide.  She has GERD on omeprazole.  She has a prescription for ibuprofen but rarely takes this.  She states that since she has been on the increased amlodipine her blood pressure has been running in the mid 979Y systolically.  She denies any leg swelling.  She denies any chest pain.  She has not been exercising as she had in the past during this COVID-19 pandemic.  She also had undergone low back surgery several months ago at L3-L4 by Dr. Deri Fuelling.  During that evaluation, her blood pressure was elevated and I recommended slight titration of amlodipine to 7.5 mg daily.  I saw her on Jun 11, 2020 and since her prior evaluation she continued to  be followed by Dr. Harrington Challenger for primary care.  She was on amlodipine 7.5 mg for hypertension and s hydroxychloroquine and leflunomide for her rheumatoid arthritis.  She continued on rosuvastatin 20 mg for hyperlipidemia and omeprazole for GERD.  During her evaluation, her blood pressure was elevated.  With her previously documented diastolic dysfunction I elected to add at losartan initially 25 mg for 4 days with dose increased to 50 mg.  I recommended she undergo a follow-up echo Doppler study for reassessment of her systolic murmur and systolic and diastolic function.  She underwent an echo Doppler study on July 15, 2020.  EF was 65 to 70%.  Diastolic parameters were indeterminate.  There was mild  aortic sclerosis without stenosis.  I last saw September 23, 2020 feeling well and was walking, riding an exercise bike, and was doing water aerobics approximately 5 days/week.  She denied any chest pain, PND orthopnea, exertional dyspnea or palpitations.  Her laboratory from June 2022 was reviewed.  She continues to be on rosuvastatin total cholesterol 184, triglycerides 46, HDL 95 and LDL 80.  She was evaluated by [name], PA-C on May 29, 2021.  At that time she was experiencing some heart fluttering sensation which was typically occurring after her morning coffee getting out of the car.  She denied any presyncope.  She continues to exercise regularly and denied any chest pain or shortness of breath.  She continues to have chronic fatigue contributed by her rheumatoid arthritis.  She wore a Zio patch monitor which showed normal sinus rhythm with intermittent bundle branch block.  She also had detection of a junctional rhythm within 45 seconds of asymptomatic patient event.  She was started on Toprol XL 25 mg daily.  Saw her in follow-up on September 08, 2021 at which time she felt well.  Her palpitations have resolved with beta-blocker therapy.  At times she admits that she may be tired and admits that she does not sleep very well.  Her blood pressure has been stable. She currently is on amlodipine 7.5 mg, losartan 50 mg, and metoprolol succinate 25 mg daily.  She was on gabapentin for neuropathy.  She is on rosuvastatin 20 mg for lipid management.   Since I last saw her, she was evaluated by Dr. Lars Mage Lamount Cranker 06/2021.  Palpitations have improved since initiating the Toprol XL.  She denied any syncope but times admitted to room spinning dizziness when bending forward felt possibly contributed by vertigo.  She continues to be active.  He felt her heart rhythm was stable.  Presently, Michelle Castaneda continues to feel well.  She is unaware of any palpitations since initiating Toprol-XL.  She continues  to be active doing water aerobics, riding a stationary bike and walking on a track.  Denies any presyncope or syncope.  She is scheduled to see Dr. Harle Battiest who checks laboratory.  She presents for follow-up evaluation.  Past Medical History:  Diagnosis Date   DDD (degenerative disc disease), lumbar    GERD (gastroesophageal reflux disease)    Heart murmur    a. 06/2016: echo showing EF of 60-65% with Grade 1 DD and mild AI.    Hyperlipidemia    Hypertension    Pneumonia    RA (rheumatoid arthritis) (HCC)    Spondylolisthesis of lumbar region    Wears dentures    Wears glasses     Past Surgical History:  Procedure Laterality Date   BREAST SURGERY     CERVICAL POLYPECTOMY N/A  05/30/2015   Procedure: POLYPECTOMY;  Surgeon: Christophe Louis, MD;  Location: [facility];  Service: Gynecology;  Laterality: N/A;   COLONOSCOPY     DIAGNOSTIC LAPAROSCOPY     DILATATION & CURETTAGE/HYSTEROSCOPY WITH MYOSURE N/A 05/30/2015   Procedure: DILATATION & CURETTAGE/HYSTEROSCOPY WITH MYOSURE;  Surgeon: Christophe Louis, MD;  Location: [facility];  Service: Gynecology;  Laterality: N/A;   LAPAROSCOPY FOR ECTOPIC PREGNANCY     MULTIPLE TOOTH EXTRACTIONS     THYROID SURGERY     TUBAL LIGATION      Current Medications: Outpatient Medications Prior to Visit  Medication Sig Dispense Refill   amLODipine (NORVASC) 2.5 MG tablet Take one tablet along with your amlodipine 20m tablet to total 7.566mdaily. 90 tablet 3   amLODipine (NORVASC) 5 MG tablet Take 1 tablet (5 mg total) by mouth daily. Take along with 2.5 mg = 7.5 mg 90 tablet 3   certolizumab pegol (CIMZIA) 2 X 200 MG KIT 400107m   cyclobenzaprine (FLEXERIL) 5 MG tablet Take 5 mg by mouth daily as needed.     gabapentin (NEURONTIN) 100 MG capsule 100 mg 3 (three) times daily.     gabapentin (NEURONTIN) 300 MG capsule TAKE 1 CAPSULE BY MOUTH EVERYDAY AT BEDTIME     hydroxychloroquine (PLAQUENIL) 200 MG tablet Take 200 mg by mouth 2 (two) times daily.     ibuprofen  (ADVIL,MOTRIN) 800 MG tablet Take 1 tablet (800 mg total) by mouth every 8 (eight) hours as needed for mild pain or moderate pain. 30 tablet 1   losartan (COZAAR) 50 MG tablet Take 1 tablet (50 mg total) by mouth daily. 90 tablet 2   metoprolol succinate (TOPROL XL) 25 MG 24 hr tablet Take 0.5 tablets (12.5 mg total) by mouth daily. Ok to take additional 12.5 mg AS NEEDED for palpitations 45 tablet 3   Multiple Vitamin (MULTIVITAMIN WITH MINERALS) TABS tablet Take 1 tablet by mouth every other day.     naphazoline-pheniramine (VISINE) 0.025-0.3 % ophthalmic solution 1 drop into both eyes as needed for itching     omeprazole (PRILOSEC) 20 MG capsule Take 1 capsule (20 mg total) by mouth daily. 30 capsule 0   Polyethyl Glycol-Propyl Glycol (SYSTANE OP) Place 1 drop into both eyes daily as needed (itching).     rosuvastatin (CRESTOR) 20 MG tablet Take 1 tablet (20 mg total) by mouth daily. TAKE 1 TABLET (20 MG TOTAL) BY MOUTH DAILY. 90 tablet 2   No facility-administered medications prior to visit.     Allergies:   Aspirin, Iodine, Naprosyn [naproxen], and Tramadol   Social History   Socioeconomic History   Marital status: Married    Spouse name: Not on file   Number of children: Not on file   Years of education: Not on file   Highest education level: Not on file  Occupational History   Not on file  Tobacco Use   Smoking status: Former    Types: Cigarettes   Smokeless tobacco: Never  Vaping Use   Vaping Use: Never used  Substance and Sexual Activity   Alcohol use: Yes    Comment: occ   Drug use: No   Sexual activity: Not on file  Other Topics Concern   Not on file  Social History Narrative   Not on file   Social Determinants of Health   Financial Resource Strain: Not on file  Food Insecurity: Not on file  Transportation Needs: Not on file  Physical Activity: Not on file  Stress: Not on file  Social Connections: Not on file    Additional social history is that she is  married for 49 years.  She has 3 sons, 5 grandchildren, and 2 great-grandchildren.  I take care of he patient's husband who recently had valve surgery  Family History:  The patient's family history includes Alcoholism in her father; Diabetes in her brother, father, and paternal grandmother; Diabetes Mellitus II in her mother; Heart disease in her brother; Lung cancer in her maternal grandfather.   Her mother died at age 62 with heart failure.  She has 4 deceased brothers and 3 brothers are alive.  ROS General: Negative; No fevers, chills, or night sweats;  HEENT: Negative; No changes in vision or hearing, sinus congestion, difficulty swallowing Pulmonary: Negative; No cough, wheezing, shortness of breath, hemoptysis Cardiovascular: See history of present illness GI: Negative; No nausea, vomiting, diarrhea, or abdominal pain GU: Negative; No dysuria, hematuria, or difficulty voiding Musculoskeletal: Status post recent L3-L4 back surgery Hematologic/Oncology: Negative; no easy bruising, bleeding Endocrine: Negative; no heat/cold intolerance; no diabetes Neuro: Negative; no changes in balance, headaches Skin: Negative; No rashes or skin lesions Psychiatric: Negative; No behavioral problems, depression Sleep: Negative; No snoring, daytime sleepiness, hypersomnolence, bruxism, restless legs, hypnogognic hallucinations, no cataplexy Other comprehensive 14 point system review is negative.   PHYSICAL EXAM:   VS:  BP (!) 140/76 (BP Location: Left Arm, Patient Position: Sitting, Cuff Size: Normal)   Pulse (!) 57   Ht 5' 6"  (1.676 m)   Wt 167 lb 9.6 oz (76 kg)   SpO2 98%   BMI 27.05 kg/m     Repeat blood pressure by me was 138/66.  Wt Readings from Last 3 Encounters:  11/06/21 167 lb 9.6 oz (76 kg)  10/31/21 168 lb 9.6 oz (76.5 kg)  09/08/21 167 lb (75.8 kg)    General: Alert, oriented, no distress.  Skin: normal turgor, no rashes, warm and dry HEENT: Normocephalic, atraumatic. Pupils  equal round and reactive to light; sclera anicteric; extraocular muscles intact;  Nose without nasal septal hypertrophy Mouth/Parynx benign; Mallinpatti scale 3 Neck: No JVD, no carotid bruits; normal carotid upstroke Lungs: clear to ausculatation and percussion; no wheezing or rales Chest wall: without tenderness to palpitation Heart: PMI not displaced, RRR, s1 s2 normal, 1-7/6 systolic murmur in the aortic area at the apex no audible diastolic murmur, no rubs, gallops, thrills, or heaves Abdomen: soft, nontender; no hepatosplenomehaly, BS+; abdominal aorta nontender and not dilated by palpation. Back: no CVA tenderness Pulses 2+ Musculoskeletal: full range of motion, normal strength, no joint deformities Extremities: no clubbing cyanosis or edema, Homan's sign negative  Neurologic: grossly nonfocal; Cranial nerves grossly wnl Psychologic: Normal mood and affect    Studies/Labs Reviewed:    Zeb Comfort 02/13/22 ECG (independently read by me): Sinus bradycardia at 57 bpm.  Left bundle branch block with repolarization changes.  QTc interval 455 ms.  September 08, 2021 ECG (independently read by me): Sinus bradycardia at 49, LBBB  September 23, 2020 ECG (independently read by me): NSR at 65; LAD, LVH by voltage; no ectopy, normal intervals    Jun 11, 2020 ECG (independently read by me): NSR at 72; LVH by voltage; QTc 468 msec  Feb 14, 2019 ECG (independently read by me): Normal sinus rhythm at 63 bpm, LVH by voltage.  May 2018 ECG (independently read by me): normal sinus rhythm at 78 bpm.  Mild LVH by voltage criteria in aVL.  Normal intervals.  Recent Labs:  Latest Ref Rng & Units 07/08/2020    8:20 AM 07/21/2018    8:48 AM 09/14/2016    9:18 AM  BMP  Glucose 65 - 99 mg/dL 99  105  118   BUN 8 - 27 mg/dL 16  11  20    Creatinine 0.57 - 1.00 mg/dL 0.57  0.60  0.74   BUN/Creat Ratio 12 - 28 28   27    Sodium 134 - 144 mmol/L 145  140  142   Potassium 3.5 - 5.2 mmol/L 4.0  4.0  5.2    Chloride 96 - 106 mmol/L 109  108  106   CO2 20 - 29 mmol/L 22  26  24    Calcium 8.7 - 10.3 mg/dL 9.5  9.9  10.4         Latest Ref Rng & Units 07/08/2020    8:20 AM 09/14/2016    9:18 AM  Hepatic Function  Total Protein 6.0 - 8.5 g/dL 6.1  6.7   Albumin 3.7 - 4.7 g/dL 4.3  4.4   AST 0 - 40 IU/L 23  28   ALT 0 - 32 IU/L 20  30   Alk Phosphatase 44 - 121 IU/L 105  105   Total Bilirubin 0.0 - 1.2 mg/dL 0.2  0.3        Latest Ref Rng & Units 07/08/2020    8:20 AM 07/21/2018    8:48 AM 07/03/2016   11:54 PM  CBC  WBC 3.4 - 10.8 x10E3/uL 3.5  3.2  4.7   Hemoglobin 11.1 - 15.9 g/dL 12.7  13.2  12.6   Hematocrit 34.0 - 46.6 % 38.4  42.5  38.6   Platelets 150 - 450 x10E3/uL 158  140  158    Lab Results  Component Value Date   MCV 90 07/08/2020   MCV 93.4 07/21/2018   MCV 88.9 07/03/2016   Lab Results  Component Value Date   TSH 0.777 07/08/2020   No results found for: "HGBA1C"   BNP No results found for: "BNP"  ProBNP No results found for: "PROBNP"   Lipid Panel     Component Value Date/Time   CHOL 184 07/08/2020 0820   TRIG 46 07/08/2020 0820   HDL 95 07/08/2020 0820   CHOLHDL 1.9 07/08/2020 0820   LDLCALC 80 07/08/2020 0820     RADIOLOGY: No results found.   Additional studies/ records that were reviewed today include:  I reviewed the patient's records from [facility]  Echo Doppler from June 2018 was reviewed  ECHO: 07/15/2020 IMPRESSIONS   1. Left ventricular ejection fraction, by estimation, is 65 to 70%. The  left ventricle has normal function. The left ventricle has no regional  wall motion abnormalities. Left ventricular diastolic parameters are  indeterminate.   2. Right ventricular systolic function is normal. The right ventricular  size is normal. There is normal pulmonary artery systolic pressure.   3. The mitral valve is normal in structure. Trivial mitral valve  regurgitation. No evidence of mitral stenosis.   4. The aortic valve  is tricuspid. Aortic valve regurgitation is mild.  Mild aortic valve sclerosis is present, with no evidence of aortic valve  stenosis.   5. The inferior vena cava is normal in size with greater than 50%  respiratory variability, suggesting right atrial pressure of 3 mmHg.   Comparison(s): No significant change from prior study.   Conclusion(s)/Recommendation(s): Otherwise normal echocardiogram, with  minor abnormalities described in the report.   ASSESSMENT:  1. Essential hypertension   2. Left bundle branch block   3. Pure hypercholesterolemia   4. Aortic valve sclerosis     PLAN:  Michelle Castaneda is a 71 year-old female who admits to a long-standing history of a cardiac murmur.  In the [facility] records, there was mention of a possible bicuspid aortic valve.  A 2D echo Doppler study from June 2018 showed a structurally normal aortic valve.  There was no mention of a bicuspid valve.  She had mild aortic insufficiency.  There was no evidence for aortic valve stenosis or significant sclerosis.  She also had trace MR.  LV function was normal with EF estimated 60 to 65% with grade 1 diastolic dysfunction.  Over the last several years she required medication titration for more optimal blood pressure control.  When I saw her in May 2022 her blood pressure was elevated on amlodipine 7.5 mg.  With her previous demonstration of mild diastolic dysfunction and mild blood pressure elevation at that time I recommended initiation of ARB therapy with losartan and she has been titrated to 50 mg daily.  An echo Doppler study on July 23, 2020 continued to show hyperdynamic LV function without wall motion abnormalities.  There was evidence for mild aortic sclerosis without stenosis.  She had normal estimated RV systolic pressure.  She had recently experienced palpitations and on Zio patch monitoring but sinus rhythm with intermittent episodes of bundle branch block, and several episodes of patient triggered  junctional rhythm with 5 short-lived episodes of SVT.  She has been on low-dose Toprol XL currently at 12.5 mg which had been reduced from 25 mg.  Higher dosing he had significantly increased fatigability with heart rate in the upper 40s.  ECG today shows sinus bradycardia at 57 bpm with left bundle branch block.  Her blood pressure is stable on her current regimen of amlodipine 7.5 mg, losartan 50 mg and metoprolol succinate 12.5 mg daily.  She has been evaluated by Dr. Quentin Ore.  She sees Dr. Mardene Sayer for primary care and will be undergoing follow-up laboratory.  She continues to be on rosuvastatin 20 mg daily for hyperlipidemia.  LDL cholesterol last year was 80.  Cardiac murmur is stable.  I encouraged her continued exercise and activity.  As long as she stable I will see her in 1 year for reevaluation or sooner as needed.    Medication Adjustments/Labs and Tests Ordered: Current medicines are reviewed at length with the patient today.  Concerns regarding medicines are outlined above.  Medication changes, Labs and Tests ordered today are listed in the Patient Instructions below. Patient Instructions  Medication Instructions:  Your Physician recommend you continue on your current medication as directed.    *If you need a refill on your cardiac medications before your next appointment, please call your pharmacy*   Lab Work: None ordered today   Testing/Procedures: None ordered today   Follow-Up: At [facility], you and your health needs are our priority.  As part of our continuing mission to provide you with exceptional heart care, we have created designated Provider Care Teams.  These Care Teams include your primary Cardiologist (physician) and Advanced Practice Providers (APPs -  Physician Assistants and Nurse Practitioners) who all work together to provide you with the care you need, when you need it.  We recommend signing up for the patient portal called "MyChart".   Sign up information is provided on this After Visit Summary.  MyChart is used to  connect with patients for Virtual Visits (Telemedicine).  Patients are able to view lab/test results, encounter notes, upcoming appointments, etc.  Non-urgent messages can be sent to your provider as well.   To learn more about what you can do with MyChart, go to [email or web address].    Your next appointment:   1 year(s)  The format for your next appointment:   In Person  Provider:   Shelva Majestic, MD             Signed, Shelva Majestic, MD  11/06/2021 8:44 AM    [facility] [address] [place], [place], [place]  [address] Phone: [phone]

## 2021-11-06 NOTE — Patient Instructions (Signed)
Medication Instructions:  Your Physician recommend you continue on your current medication as directed.    *If you need a refill on your cardiac medications before your next appointment, please call your pharmacy*   Lab Work: None ordered today   Testing/Procedures: None ordered today   Follow-Up: At [facility], you and your health needs are our priority.  As part of our continuing mission to provide you with exceptional heart care, we have created designated Provider Care Teams.  These Care Teams include your primary Cardiologist (physician) and Advanced Practice Providers (APPs -  Physician Assistants and Nurse Practitioners) who all work together to provide you with the care you need, when you need it.  We recommend signing up for the patient portal called "MyChart".  Sign up information is provided on this After Visit Summary.  MyChart is used to connect with patients for Virtual Visits (Telemedicine).  Patients are able to view lab/test results, encounter notes, upcoming appointments, etc.  Non-urgent messages can be sent to your provider as well.   To learn more about what you can do with MyChart, go to [email or web address].    Your next appointment:   1 year(s)  The format for your next appointment:   In Person  Provider:   Thomas Kelly, MD          

## 2021-11-27 DIAGNOSIS — M4310 Spondylolisthesis, site unspecified: Secondary | ICD-10-CM | POA: Diagnosis not present

## 2021-11-27 DIAGNOSIS — M5416 Radiculopathy, lumbar region: Secondary | ICD-10-CM | POA: Diagnosis not present

## 2021-12-01 DIAGNOSIS — M0579 Rheumatoid arthritis with rheumatoid factor of multiple sites without organ or systems involvement: Secondary | ICD-10-CM | POA: Diagnosis not present

## 2021-12-09 DIAGNOSIS — E7800 Pure hypercholesterolemia, unspecified: Secondary | ICD-10-CM | POA: Diagnosis not present

## 2021-12-16 DIAGNOSIS — E7800 Pure hypercholesterolemia, unspecified: Secondary | ICD-10-CM | POA: Diagnosis not present

## 2021-12-16 DIAGNOSIS — Z6829 Body mass index (BMI) 29.0-29.9, adult: Secondary | ICD-10-CM | POA: Diagnosis not present

## 2021-12-16 DIAGNOSIS — Z0000 Encounter for general adult medical examination without abnormal findings: Secondary | ICD-10-CM | POA: Diagnosis not present

## 2021-12-16 DIAGNOSIS — K219 Gastro-esophageal reflux disease without esophagitis: Secondary | ICD-10-CM | POA: Diagnosis not present

## 2021-12-23 DIAGNOSIS — M545 Low back pain, unspecified: Secondary | ICD-10-CM | POA: Diagnosis not present

## 2021-12-23 DIAGNOSIS — M4310 Spondylolisthesis, site unspecified: Secondary | ICD-10-CM | POA: Diagnosis not present

## 2021-12-25 DIAGNOSIS — Z6826 Body mass index (BMI) 26.0-26.9, adult: Secondary | ICD-10-CM | POA: Diagnosis not present

## 2021-12-25 DIAGNOSIS — M4310 Spondylolisthesis, site unspecified: Secondary | ICD-10-CM | POA: Diagnosis not present

## 2021-12-29 ENCOUNTER — Other Ambulatory Visit: Payer: Self-pay | Admitting: Cardiovascular Disease

## 2021-12-29 DIAGNOSIS — M0579 Rheumatoid arthritis with rheumatoid factor of multiple sites without organ or systems involvement: Secondary | ICD-10-CM | POA: Diagnosis not present

## 2022-01-09 DIAGNOSIS — Z6826 Body mass index (BMI) 26.0-26.9, adult: Secondary | ICD-10-CM | POA: Diagnosis not present

## 2022-01-09 DIAGNOSIS — E663 Overweight: Secondary | ICD-10-CM | POA: Diagnosis not present

## 2022-01-09 DIAGNOSIS — M5136 Other intervertebral disc degeneration, lumbar region: Secondary | ICD-10-CM | POA: Diagnosis not present

## 2022-01-09 DIAGNOSIS — M1991 Primary osteoarthritis, unspecified site: Secondary | ICD-10-CM | POA: Diagnosis not present

## 2022-01-09 DIAGNOSIS — M0579 Rheumatoid arthritis with rheumatoid factor of multiple sites without organ or systems involvement: Secondary | ICD-10-CM | POA: Diagnosis not present

## 2022-01-09 DIAGNOSIS — Z79899 Other long term (current) drug therapy: Secondary | ICD-10-CM | POA: Diagnosis not present

## 2022-01-13 DIAGNOSIS — M47816 Spondylosis without myelopathy or radiculopathy, lumbar region: Secondary | ICD-10-CM | POA: Diagnosis not present

## 2022-01-28 DIAGNOSIS — M0579 Rheumatoid arthritis with rheumatoid factor of multiple sites without organ or systems involvement: Secondary | ICD-10-CM | POA: Diagnosis not present

## 2022-01-30 DIAGNOSIS — M47816 Spondylosis without myelopathy or radiculopathy, lumbar region: Secondary | ICD-10-CM | POA: Diagnosis not present

## 2022-02-10 DIAGNOSIS — M069 Rheumatoid arthritis, unspecified: Secondary | ICD-10-CM | POA: Diagnosis not present

## 2022-02-10 DIAGNOSIS — Z79891 Long term (current) use of opiate analgesic: Secondary | ICD-10-CM | POA: Diagnosis not present

## 2022-02-11 DIAGNOSIS — M47816 Spondylosis without myelopathy or radiculopathy, lumbar region: Secondary | ICD-10-CM | POA: Diagnosis not present

## 2022-02-25 DIAGNOSIS — M0579 Rheumatoid arthritis with rheumatoid factor of multiple sites without organ or systems involvement: Secondary | ICD-10-CM | POA: Diagnosis not present

## 2022-03-25 DIAGNOSIS — M0579 Rheumatoid arthritis with rheumatoid factor of multiple sites without organ or systems involvement: Secondary | ICD-10-CM | POA: Diagnosis not present

## 2022-04-02 DIAGNOSIS — Z6826 Body mass index (BMI) 26.0-26.9, adult: Secondary | ICD-10-CM | POA: Diagnosis not present

## 2022-04-02 DIAGNOSIS — M47816 Spondylosis without myelopathy or radiculopathy, lumbar region: Secondary | ICD-10-CM | POA: Diagnosis not present

## 2022-04-22 DIAGNOSIS — Z79899 Other long term (current) drug therapy: Secondary | ICD-10-CM | POA: Diagnosis not present

## 2022-04-22 DIAGNOSIS — M0579 Rheumatoid arthritis with rheumatoid factor of multiple sites without organ or systems involvement: Secondary | ICD-10-CM | POA: Diagnosis not present

## 2022-05-04 DIAGNOSIS — Z658 Other specified problems related to psychosocial circumstances: Secondary | ICD-10-CM | POA: Diagnosis not present

## 2022-05-04 DIAGNOSIS — M542 Cervicalgia: Secondary | ICD-10-CM | POA: Diagnosis not present

## 2022-05-04 DIAGNOSIS — Z6828 Body mass index (BMI) 28.0-28.9, adult: Secondary | ICD-10-CM | POA: Diagnosis not present

## 2022-05-20 DIAGNOSIS — M0579 Rheumatoid arthritis with rheumatoid factor of multiple sites without organ or systems involvement: Secondary | ICD-10-CM | POA: Diagnosis not present

## 2022-05-25 ENCOUNTER — Other Ambulatory Visit: Payer: Self-pay | Admitting: Cardiovascular Disease

## 2022-06-17 DIAGNOSIS — M0579 Rheumatoid arthritis with rheumatoid factor of multiple sites without organ or systems involvement: Secondary | ICD-10-CM | POA: Diagnosis not present

## 2022-06-17 DIAGNOSIS — Z79899 Other long term (current) drug therapy: Secondary | ICD-10-CM | POA: Diagnosis not present

## 2022-06-29 ENCOUNTER — Other Ambulatory Visit: Payer: Self-pay | Admitting: Family Medicine

## 2022-06-29 DIAGNOSIS — Z0000 Encounter for general adult medical examination without abnormal findings: Secondary | ICD-10-CM

## 2022-06-30 ENCOUNTER — Ambulatory Visit
Admission: RE | Admit: 2022-06-30 | Discharge: 2022-06-30 | Disposition: A | Discharge status: Home / Self Care | Payer: Medicare PPO | Source: Ambulatory Visit | Attending: Family Medicine | Admitting: Family Medicine

## 2022-06-30 DIAGNOSIS — Z0000 Encounter for general adult medical examination without abnormal findings: Secondary | ICD-10-CM

## 2022-06-30 DIAGNOSIS — Z1231 Encounter for screening mammogram for malignant neoplasm of breast: Secondary | ICD-10-CM | POA: Diagnosis not present

## 2022-07-09 DIAGNOSIS — M47816 Spondylosis without myelopathy or radiculopathy, lumbar region: Secondary | ICD-10-CM | POA: Diagnosis not present

## 2022-07-09 DIAGNOSIS — Z6826 Body mass index (BMI) 26.0-26.9, adult: Secondary | ICD-10-CM | POA: Diagnosis not present

## 2022-07-10 DIAGNOSIS — M1991 Primary osteoarthritis, unspecified site: Secondary | ICD-10-CM | POA: Diagnosis not present

## 2022-07-10 DIAGNOSIS — Z6826 Body mass index (BMI) 26.0-26.9, adult: Secondary | ICD-10-CM | POA: Diagnosis not present

## 2022-07-10 DIAGNOSIS — E663 Overweight: Secondary | ICD-10-CM | POA: Diagnosis not present

## 2022-07-10 DIAGNOSIS — M0579 Rheumatoid arthritis with rheumatoid factor of multiple sites without organ or systems involvement: Secondary | ICD-10-CM | POA: Diagnosis not present

## 2022-07-10 DIAGNOSIS — Z79899 Other long term (current) drug therapy: Secondary | ICD-10-CM | POA: Diagnosis not present

## 2022-07-10 DIAGNOSIS — F5104 Psychophysiologic insomnia: Secondary | ICD-10-CM | POA: Diagnosis not present

## 2022-07-15 DIAGNOSIS — M0579 Rheumatoid arthritis with rheumatoid factor of multiple sites without organ or systems involvement: Secondary | ICD-10-CM | POA: Diagnosis not present

## 2022-07-23 DIAGNOSIS — I499 Cardiac arrhythmia, unspecified: Secondary | ICD-10-CM | POA: Diagnosis not present

## 2022-07-23 DIAGNOSIS — H269 Unspecified cataract: Secondary | ICD-10-CM | POA: Diagnosis not present

## 2022-07-23 DIAGNOSIS — N182 Chronic kidney disease, stage 2 (mild): Secondary | ICD-10-CM | POA: Diagnosis not present

## 2022-07-23 DIAGNOSIS — M5440 Lumbago with sciatica, unspecified side: Secondary | ICD-10-CM | POA: Diagnosis not present

## 2022-07-23 DIAGNOSIS — K5900 Constipation, unspecified: Secondary | ICD-10-CM | POA: Diagnosis not present

## 2022-07-23 DIAGNOSIS — M62838 Other muscle spasm: Secondary | ICD-10-CM | POA: Diagnosis not present

## 2022-07-23 DIAGNOSIS — E785 Hyperlipidemia, unspecified: Secondary | ICD-10-CM | POA: Diagnosis not present

## 2022-07-23 DIAGNOSIS — K219 Gastro-esophageal reflux disease without esophagitis: Secondary | ICD-10-CM | POA: Diagnosis not present

## 2022-07-23 DIAGNOSIS — M1990 Unspecified osteoarthritis, unspecified site: Secondary | ICD-10-CM | POA: Diagnosis not present

## 2022-08-05 DIAGNOSIS — R42 Dizziness and giddiness: Secondary | ICD-10-CM | POA: Diagnosis not present

## 2022-08-05 DIAGNOSIS — R110 Nausea: Secondary | ICD-10-CM | POA: Diagnosis not present

## 2022-08-05 DIAGNOSIS — I10 Essential (primary) hypertension: Secondary | ICD-10-CM | POA: Diagnosis not present

## 2022-08-05 DIAGNOSIS — Z6823 Body mass index (BMI) 23.0-23.9, adult: Secondary | ICD-10-CM | POA: Diagnosis not present

## 2022-08-07 ENCOUNTER — Other Ambulatory Visit: Payer: Self-pay | Admitting: Cardiovascular Disease

## 2022-08-12 DIAGNOSIS — M0579 Rheumatoid arthritis with rheumatoid factor of multiple sites without organ or systems involvement: Secondary | ICD-10-CM | POA: Diagnosis not present

## 2022-08-19 DIAGNOSIS — Z6829 Body mass index (BMI) 29.0-29.9, adult: Secondary | ICD-10-CM | POA: Diagnosis not present

## 2022-08-19 DIAGNOSIS — M0579 Rheumatoid arthritis with rheumatoid factor of multiple sites without organ or systems involvement: Secondary | ICD-10-CM | POA: Diagnosis not present

## 2022-08-19 DIAGNOSIS — I10 Essential (primary) hypertension: Secondary | ICD-10-CM | POA: Diagnosis not present

## 2022-08-19 DIAGNOSIS — R42 Dizziness and giddiness: Secondary | ICD-10-CM | POA: Diagnosis not present

## 2022-08-19 DIAGNOSIS — Z09 Encounter for follow-up examination after completed treatment for conditions other than malignant neoplasm: Secondary | ICD-10-CM | POA: Diagnosis not present

## 2022-09-09 DIAGNOSIS — M0579 Rheumatoid arthritis with rheumatoid factor of multiple sites without organ or systems involvement: Secondary | ICD-10-CM | POA: Diagnosis not present

## 2022-09-09 DIAGNOSIS — R5383 Other fatigue: Secondary | ICD-10-CM | POA: Diagnosis not present

## 2022-09-09 DIAGNOSIS — Z111 Encounter for screening for respiratory tuberculosis: Secondary | ICD-10-CM | POA: Diagnosis not present

## 2022-09-09 DIAGNOSIS — Z79899 Other long term (current) drug therapy: Secondary | ICD-10-CM | POA: Diagnosis not present

## 2022-10-07 DIAGNOSIS — M0579 Rheumatoid arthritis with rheumatoid factor of multiple sites without organ or systems involvement: Secondary | ICD-10-CM | POA: Diagnosis not present

## 2022-10-19 ENCOUNTER — Other Ambulatory Visit: Payer: Self-pay | Admitting: Cardiovascular Disease

## 2022-10-22 ENCOUNTER — Other Ambulatory Visit: Payer: Self-pay | Admitting: Cardiovascular Disease

## 2022-11-04 DIAGNOSIS — M0579 Rheumatoid arthritis with rheumatoid factor of multiple sites without organ or systems involvement: Secondary | ICD-10-CM | POA: Diagnosis not present

## 2022-11-09 DIAGNOSIS — M25511 Pain in right shoulder: Secondary | ICD-10-CM | POA: Diagnosis not present

## 2022-11-09 DIAGNOSIS — Z6828 Body mass index (BMI) 28.0-28.9, adult: Secondary | ICD-10-CM | POA: Diagnosis not present

## 2022-11-11 DIAGNOSIS — M47816 Spondylosis without myelopathy or radiculopathy, lumbar region: Secondary | ICD-10-CM | POA: Diagnosis not present

## 2022-11-11 DIAGNOSIS — Z6827 Body mass index (BMI) 27.0-27.9, adult: Secondary | ICD-10-CM | POA: Diagnosis not present

## 2022-12-02 DIAGNOSIS — M0579 Rheumatoid arthritis with rheumatoid factor of multiple sites without organ or systems involvement: Secondary | ICD-10-CM | POA: Diagnosis not present

## 2022-12-02 DIAGNOSIS — Z79899 Other long term (current) drug therapy: Secondary | ICD-10-CM | POA: Diagnosis not present

## 2022-12-18 DIAGNOSIS — E7800 Pure hypercholesterolemia, unspecified: Secondary | ICD-10-CM | POA: Diagnosis not present

## 2022-12-23 DIAGNOSIS — K219 Gastro-esophageal reflux disease without esophagitis: Secondary | ICD-10-CM | POA: Diagnosis not present

## 2022-12-23 DIAGNOSIS — Z6828 Body mass index (BMI) 28.0-28.9, adult: Secondary | ICD-10-CM | POA: Diagnosis not present

## 2022-12-23 DIAGNOSIS — Z79899 Other long term (current) drug therapy: Secondary | ICD-10-CM | POA: Diagnosis not present

## 2022-12-23 DIAGNOSIS — F321 Major depressive disorder, single episode, moderate: Secondary | ICD-10-CM | POA: Diagnosis not present

## 2022-12-23 DIAGNOSIS — Z0000 Encounter for general adult medical examination without abnormal findings: Secondary | ICD-10-CM | POA: Diagnosis not present

## 2022-12-23 DIAGNOSIS — R7301 Impaired fasting glucose: Secondary | ICD-10-CM | POA: Diagnosis not present

## 2022-12-30 DIAGNOSIS — M0579 Rheumatoid arthritis with rheumatoid factor of multiple sites without organ or systems involvement: Secondary | ICD-10-CM | POA: Diagnosis not present

## 2023-01-08 DIAGNOSIS — E663 Overweight: Secondary | ICD-10-CM | POA: Diagnosis not present

## 2023-01-08 DIAGNOSIS — Z6825 Body mass index (BMI) 25.0-25.9, adult: Secondary | ICD-10-CM | POA: Diagnosis not present

## 2023-01-08 DIAGNOSIS — Z79899 Other long term (current) drug therapy: Secondary | ICD-10-CM | POA: Diagnosis not present

## 2023-01-08 DIAGNOSIS — M545 Low back pain, unspecified: Secondary | ICD-10-CM | POA: Diagnosis not present

## 2023-01-08 DIAGNOSIS — M0579 Rheumatoid arthritis with rheumatoid factor of multiple sites without organ or systems involvement: Secondary | ICD-10-CM | POA: Diagnosis not present

## 2023-01-08 DIAGNOSIS — M1991 Primary osteoarthritis, unspecified site: Secondary | ICD-10-CM | POA: Diagnosis not present

## 2023-02-01 ENCOUNTER — Ambulatory Visit: Payer: Medicare PPO | Admitting: Cardiovascular Disease

## 2023-02-03 DIAGNOSIS — M0579 Rheumatoid arthritis with rheumatoid factor of multiple sites without organ or systems involvement: Secondary | ICD-10-CM | POA: Diagnosis not present

## 2023-02-23 DIAGNOSIS — Z658 Other specified problems related to psychosocial circumstances: Secondary | ICD-10-CM | POA: Diagnosis not present

## 2023-02-23 DIAGNOSIS — R7303 Prediabetes: Secondary | ICD-10-CM | POA: Diagnosis not present

## 2023-02-23 DIAGNOSIS — E7800 Pure hypercholesterolemia, unspecified: Secondary | ICD-10-CM | POA: Diagnosis not present

## 2023-02-23 DIAGNOSIS — R7301 Impaired fasting glucose: Secondary | ICD-10-CM | POA: Diagnosis not present

## 2023-02-23 DIAGNOSIS — F321 Major depressive disorder, single episode, moderate: Secondary | ICD-10-CM | POA: Diagnosis not present

## 2023-02-23 DIAGNOSIS — Z79899 Other long term (current) drug therapy: Secondary | ICD-10-CM | POA: Diagnosis not present

## 2023-02-23 DIAGNOSIS — I10 Essential (primary) hypertension: Secondary | ICD-10-CM | POA: Diagnosis not present

## 2023-02-23 DIAGNOSIS — Z6828 Body mass index (BMI) 28.0-28.9, adult: Secondary | ICD-10-CM | POA: Diagnosis not present

## 2023-03-03 DIAGNOSIS — M0579 Rheumatoid arthritis with rheumatoid factor of multiple sites without organ or systems involvement: Secondary | ICD-10-CM | POA: Diagnosis not present

## 2023-03-08 ENCOUNTER — Other Ambulatory Visit: Payer: Self-pay | Admitting: Cardiovascular Disease

## 2023-03-09 MED ORDER — AMLODIPINE BESYLATE 5 MG PO TABS: ORAL_TABLET | ORAL | 0 refills | Status: DC

## 2023-03-30 NOTE — Progress Notes (Deleted)
  Cardiology Office Note   Date:  03/30/2023  ID:  Bentli Llorente, DOB 11/24/1950, MRN [id number] PCP:  Daisy Floro, MD [facility] Cardiologist: Nicki Guadalajara, MD  Reason for visit: 1 year follow-up  History of Present Illness    Michelle Castaneda is a 73 y.o. female with a hx of rheumatoid arthritis, status post a parathyroidectomy and thyroid nodule removal, hypertension, hyperlipidemia, palpitations.  I saw her last in May 2023.  She complained of heart fluttering sensations.  Zio patch showed normal sinus rhythm with intermittent bundle branch block, intermittent junctional rhythm. She was started on Toprol XL 25 mg daily.  She saw Dr. Steffanie Dunn with the EP.  With improved palpitations on Toprol XL, Toprol XL was continued.  No presyncope/syncope.  She last saw Dr. Tresa Endo in October 2023.  She was feeling well without palpitations.  She was active doing water aerobics, riding stationary bike and walking on a track.  With increased fatigue and heart rates in upper 40s, Toprol XL reduced to 12.5 mg daily.  Today, ***  Palpitations Zio patch showed normal sinus rhythm with intermittent bundle branch block, intermittent junctional rhythm. Palpitations improved with Toprol XL. -***  Hypertension -*** -Goal BP is <130/80.  Recommend DASH diet (high in vegetables, fruits, low-fat dairy products, whole grains, poultry, fish, and nuts and low in sweets, sugar-sweetened beverages, and red meats), salt restriction and increase physical activity.  Hyperlipidemia -*** -Recommend cholesterol lowering diets - Mediterranean diet, DASH diet, vegetarian diet, low-carbohydrate diet and avoidance of trans fats.  Discussed healthier choice substitutes.  Nuts, high-fiber foods, and fiber supplements may also improve lipids.    Disposition - Follow-up in ***     Objective / Physical Exam   EKG today: ***  Vital signs:  There were no vitals taken for this visit.    GEN:  No acute distress NECK: No carotid bruits CARDIAC: ***RRR, no murmurs RESPIRATORY:  Clear to auscultation without rales, wheezing or rhonchi  EXTREMITIES: No edema  Assessment and Plan   ***   {Are you ordering a CV Procedure (e.g. stress test, cath, DCCV, TEE, etc)?   Press F2        :[id number]}    Signed, Bernette Mayers  03/30/2023 [facility]

## 2023-03-31 ENCOUNTER — Ambulatory Visit: Payer: Medicare PPO | Attending: Physician Assistant | Admitting: Physician Assistant

## 2023-03-31 DIAGNOSIS — I10 Essential (primary) hypertension: Secondary | ICD-10-CM

## 2023-03-31 DIAGNOSIS — E7800 Pure hypercholesterolemia, unspecified: Secondary | ICD-10-CM

## 2023-03-31 DIAGNOSIS — R002 Palpitations: Secondary | ICD-10-CM

## 2023-03-31 DIAGNOSIS — M0579 Rheumatoid arthritis with rheumatoid factor of multiple sites without organ or systems involvement: Secondary | ICD-10-CM | POA: Diagnosis not present

## 2023-04-05 ENCOUNTER — Other Ambulatory Visit: Payer: Self-pay | Admitting: Cardiovascular Disease

## 2023-04-15 DIAGNOSIS — K219 Gastro-esophageal reflux disease without esophagitis: Secondary | ICD-10-CM | POA: Diagnosis not present

## 2023-04-15 DIAGNOSIS — K5900 Constipation, unspecified: Secondary | ICD-10-CM | POA: Diagnosis not present

## 2023-04-15 DIAGNOSIS — E785 Hyperlipidemia, unspecified: Secondary | ICD-10-CM | POA: Diagnosis not present

## 2023-04-15 DIAGNOSIS — D84821 Immunodeficiency due to drugs: Secondary | ICD-10-CM | POA: Diagnosis not present

## 2023-04-15 DIAGNOSIS — M1990 Unspecified osteoarthritis, unspecified site: Secondary | ICD-10-CM | POA: Diagnosis not present

## 2023-04-15 DIAGNOSIS — M069 Rheumatoid arthritis, unspecified: Secondary | ICD-10-CM | POA: Diagnosis not present

## 2023-04-15 DIAGNOSIS — Z8249 Family history of ischemic heart disease and other diseases of the circulatory system: Secondary | ICD-10-CM | POA: Diagnosis not present

## 2023-04-15 DIAGNOSIS — F419 Anxiety disorder, unspecified: Secondary | ICD-10-CM | POA: Diagnosis not present

## 2023-04-15 DIAGNOSIS — F329 Major depressive disorder, single episode, unspecified: Secondary | ICD-10-CM | POA: Diagnosis not present

## 2023-04-28 DIAGNOSIS — M0579 Rheumatoid arthritis with rheumatoid factor of multiple sites without organ or systems involvement: Secondary | ICD-10-CM | POA: Diagnosis not present

## 2023-05-26 DIAGNOSIS — M0579 Rheumatoid arthritis with rheumatoid factor of multiple sites without organ or systems involvement: Secondary | ICD-10-CM | POA: Diagnosis not present

## 2023-05-26 DIAGNOSIS — Z79899 Other long term (current) drug therapy: Secondary | ICD-10-CM | POA: Diagnosis not present

## 2023-06-14 ENCOUNTER — Other Ambulatory Visit: Payer: Self-pay | Admitting: Family Medicine

## 2023-06-14 DIAGNOSIS — Z1231 Encounter for screening mammogram for malignant neoplasm of breast: Secondary | ICD-10-CM

## 2023-06-20 ENCOUNTER — Other Ambulatory Visit: Payer: Self-pay | Admitting: Cardiovascular Disease

## 2023-06-23 DIAGNOSIS — M0579 Rheumatoid arthritis with rheumatoid factor of multiple sites without organ or systems involvement: Secondary | ICD-10-CM | POA: Diagnosis not present

## 2023-06-23 NOTE — Telephone Encounter (Signed)
 Dr. Arlana Bellini pt. Last OV was on 11/06/21. Had 2 followups cancelled or noshowed this year. Passed her 3rd attempt. Does Dr. Loetta Ringer want to refill? Please advise.

## 2023-06-29 ENCOUNTER — Telehealth: Payer: Self-pay | Admitting: Cardiology

## 2023-06-29 MED ORDER — METOPROLOL SUCCINATE ER 25 MG PO TB24: 12.5000 mg | ORAL_TABLET | Freq: Every day | ORAL | 0 refills | Status: DC

## 2023-06-29 MED ORDER — AMLODIPINE BESYLATE 5 MG PO TABS
ORAL_TABLET | ORAL | 0 refills | Status: DC
Start: 2023-06-29 — End: 2023-06-30

## 2023-06-29 MED ORDER — AMLODIPINE BESYLATE 2.5 MG PO TABS: ORAL_TABLET | ORAL | 0 refills | Status: DC

## 2023-06-29 NOTE — Telephone Encounter (Signed)
 Pt's medications were sent to pt's pharmacy, enough to get pt to her appt. Confirmation received.

## 2023-06-29 NOTE — Telephone Encounter (Signed)
*  STAT* If patient is at the pharmacy, call can be transferred to refill team.   1. Which medications need to be refilled? (please list name of each medication and dose if known) metoprolol  succinate (TOPROL -XL) 25 MG 24 hr tablet    2. Would you like to learn more about the convenience, safety, & potential cost savings by using the [facility]?   3. Are you open to using the [facility] (Type [facility].  ).   4. Which pharmacy/location (including street and city if local pharmacy) is medication to be sent to? [facility] - [place], [place] - [address]    5. Do they need a 30 day or 90 day supply? 90 day

## 2023-06-30 ENCOUNTER — Encounter: Payer: Self-pay | Admitting: Cardiovascular Disease

## 2023-06-30 ENCOUNTER — Ambulatory Visit: Attending: Cardiovascular Disease | Admitting: Cardiovascular Disease

## 2023-06-30 DIAGNOSIS — E7800 Pure hypercholesterolemia, unspecified: Secondary | ICD-10-CM | POA: Diagnosis not present

## 2023-06-30 DIAGNOSIS — M069 Rheumatoid arthritis, unspecified: Secondary | ICD-10-CM

## 2023-06-30 DIAGNOSIS — E785 Hyperlipidemia, unspecified: Secondary | ICD-10-CM

## 2023-06-30 DIAGNOSIS — R002 Palpitations: Secondary | ICD-10-CM

## 2023-06-30 DIAGNOSIS — I10 Essential (primary) hypertension: Secondary | ICD-10-CM

## 2023-06-30 MED ORDER — AMLODIPINE BESYLATE 10 MG PO TABS: 10.0000 mg | ORAL_TABLET | Freq: Every day | ORAL | 3 refills | Status: AC

## 2023-06-30 NOTE — Telephone Encounter (Signed)
 Patient is schedule for 6/6

## 2023-06-30 NOTE — Patient Instructions (Addendum)
 Medication Instructions:   START TAKING:  AMLODIPINE  10 MG ONCE A DAY   *If you need a refill on your cardiac medications before your next appointment, please call your pharmacy*   Lab Work: NONE ORDERED  TODAY    If you have labs (blood work) drawn today and your tests are completely normal, you will receive your results only by: MyChart Message (if you have MyChart) OR A paper copy in the mail If you have any lab test that is abnormal or we need to change your treatment, we will call you to review the results.  Testing/Procedures: NONE ORDERED  TODAY     Follow-Up: At [facility], you and your health needs are our priority.  As part of our continuing mission to provide you with exceptional heart care, our providers are all part of one team.  This team includes your primary Cardiologist (physician) and Advanced Practice Providers or APPs (Physician Assistants and Nurse Practitioners) who all work together to provide you with the care you need, when you need it.  Your next appointment:   10 month(s)  Provider:   DR Theodis Fiscal    We recommend signing up for the patient portal called "MyChart".  Sign up information is provided on this After Visit Summary.  MyChart is used to connect with patients for Virtual Visits (Telemedicine).  Patients are able to view lab/test results, encounter notes, upcoming appointments, etc.  Non-urgent messages can be sent to your provider as well.   To learn more about what you can do with MyChart, go to [email or web address].   Other Instructions

## 2023-06-30 NOTE — Progress Notes (Unsigned)
 Cardiology Office Note    Date:  06/30/2023   ID:  Stefanee Mckell, DOB [birth year], MRN [id number]  PCP:  Jimmey Mould, MD  Cardiologist:  Magnus Schuller, MD   2 -month follow-up evaluation  History of Present Illness:  Marlies Ligman is a 73 y.o. female who who was  referred through the courtesy of Dr. Erik Havens for evaluation of cardiac murmur and cardiology evaluation.  She was last seen September 08, 2021 presents for follow-up evaluation.  Ms. Thornell Flirt has a history of rheumatoid arthritis, and is status post a parathyroidectomy and thyroid  nodule removal.  She has been found to have a cardiac murmur and admits of being told of having this for a long time.  When I saw her for her initial visit, she did not recall having a previous cardiac echo.  At the time she denied any episodes of chest pain, presyncope or syncope, and awareness of change in ability to walk, PND or orthopnea.  She was exercising regularly and was in a exercise class 4 days per week for 30 minutes and on 2 other days typically walks so that she is exercising at least 6 days per week.  I have reviewed records from [facility] physicians and in the note of Dr. Avanell Bob.  There is mention that the patient has a bicuspid aortic valve.  The patient is unaware of when this diagnosis was made. Review of laboratory indicates that she has significant hyperlipidemia which is untreated.  In February 2017.  Total cholesterol was 325 with an LDL of 210 and a non-HDL of 220.  HDL was 105.  In May 2018.  Total cholesterol was 300 with an LDL of 186, and HDL remained high at 102 with normal triglycerides at 60.    When I initially saw her, I recommended she undergo a 2D echo Doppler study which was done on July 14, 2016.  This revealed normal systolic function with a EF of 60 to 65%.  There was grade 1 diastolic dysfunction.  Aortic valve appeared structurally normal.  There was no evidence for stenosis.  There was mild  aortic insufficiency.  There was trivial tricuspid regurgitation.  She was subsequently seen by Woodfin Hays, PAC in August 2018.  I saw her in December 2020.,  At that time she was on amlodipine  2.5 mg for hypertension but approximately 6 weeks ago due to elevated blood pressure this dose was increased by Dr. Cherly Corners to 5 mg daily.  She iwason rosuvastatin  20 mg for hyperlipidemia.  Recent laboratory on December 16, 2018 showed total cholesterol 190, HDL 91, LDL 90, and triglycerides 45.  She has rheumatoid arthritis and is on Hydrea chloroquine in addition to leflunomide .  She has GERD on omeprazole .  She has a prescription for ibuprofen  but rarely takes this.  She states that since she has been on the increased amlodipine  her blood pressure has been running in the mid 130s systolically.  She denies any leg swelling.  She denies any chest pain.  She has not been exercising as she had in the past during this COVID-19 pandemic.  She also had undergone low back surgery several months ago at L3-L4 by Dr. Jaycee Metro.  During that evaluation, her blood pressure was elevated and I recommended slight titration of amlodipine  to 7.5 mg daily.  I saw her on Jun 11, 2020 and since her prior evaluation she continued to  be followed by Dr. Avanell Bob for primary care.  She was on amlodipine  7.5 mg for hypertension and s hydroxychloroquine  and leflunomide  for her rheumatoid arthritis.  She continued on rosuvastatin  20 mg for hyperlipidemia and omeprazole  for GERD.  During her evaluation, her blood pressure was elevated.  With her previously documented diastolic dysfunction I elected to add at losartan  initially 25 mg for 4 days with dose increased to 50 mg.  I recommended she undergo a follow-up echo Doppler study for reassessment of her systolic murmur and systolic and diastolic function.  She underwent an echo Doppler study on July 15, 2020.  EF was 65 to 70%.  Diastolic parameters were indeterminate.  There was mild  aortic sclerosis without stenosis.  I last saw September 23, 2020 feeling well and was walking, riding an exercise bike, and was doing water aerobics approximately 5 days/week.  She denied any chest pain, PND orthopnea, exertional dyspnea or palpitations.  Her laboratory from June 2022 was reviewed.  She continues to be on rosuvastatin  total cholesterol 184, triglycerides 46, HDL 95 and LDL 80.  She was evaluated by Phyliss Breen, PA-C on May 29, 2021.  At that time she was experiencing some heart fluttering sensation which was typically occurring after her morning coffee getting out of the car.  She denied any presyncope.  She continues to exercise regularly and denied any chest pain or shortness of breath.  She continues to have chronic fatigue contributed by her rheumatoid arthritis.  She wore a Zio patch monitor which showed normal sinus rhythm with intermittent bundle branch block.  She also had detection of a junctional rhythm within 45 seconds of asymptomatic patient event.  She was started on Toprol  XL 25 mg daily.  Saw her in follow-up on September 08, 2021 at which time she felt well.  Her palpitations have resolved with beta-blocker therapy.  At times she admits that she may be tired and admits that she does not sleep very well.  Her blood pressure has been stable. She currently is on amlodipine  7.5 mg, losartan  50 mg, and metoprolol  succinate 25 mg daily.  She was on gabapentin for neuropathy.  She is on rosuvastatin  20 mg for lipid management.   Since I last saw her, she was evaluated by Dr. Harvie Liner Donal Frohlich 06/2021.  Palpitations have improved since initiating the Toprol  XL.  She denied any syncope but times admitted to room spinning dizziness when bending forward felt possibly contributed by vertigo.  She continues to be active.  He felt her heart rhythm was stable.  Presently, Ms. Thornell Flirt continues to feel well.  She is unaware of any palpitations since initiating Toprol -XL.  She continues  to be active doing water aerobics, riding a stationary bike and walking on a track.  Denies any presyncope or syncope.  She is scheduled to see Dr. Loralyn Rochester who checks laboratory.  She presents for follow-up evaluation.  Past Medical History:  Diagnosis Date   DDD (degenerative disc disease), lumbar    GERD (gastroesophageal reflux disease)    Heart murmur    a. 06/2016: echo showing EF of 60-65% with Grade 1 DD and mild AI.    Hyperlipidemia    Hypertension    Pneumonia    RA (rheumatoid arthritis) (HCC)    Spondylolisthesis of lumbar region    Wears dentures    Wears glasses     Past Surgical History:  Procedure Laterality Date   BREAST SURGERY     CERVICAL POLYPECTOMY N/A  05/30/2015   Procedure: POLYPECTOMY;  Surgeon: Arlee Lace, MD;  Location: [facility];  Service: Gynecology;  Laterality: N/A;   COLONOSCOPY     DIAGNOSTIC LAPAROSCOPY     DILATATION & CURETTAGE/HYSTEROSCOPY WITH MYOSURE N/A 05/30/2015   Procedure: DILATATION & CURETTAGE/HYSTEROSCOPY WITH MYOSURE;  Surgeon: Arlee Lace, MD;  Location: [facility];  Service: Gynecology;  Laterality: N/A;   LAPAROSCOPY FOR ECTOPIC PREGNANCY     MULTIPLE TOOTH EXTRACTIONS     THYROID  SURGERY     TUBAL LIGATION      Current Medications: Outpatient Medications Prior to Visit  Medication Sig Dispense Refill   amLODipine  (NORVASC ) 2.5 MG tablet TAKE 1 TABLET DAILY ALONG WITH AMLODIPINE  5MG  TABLET TO TOTAL 7.5MG  DAILY (MUST KEEP 3/25 APPT WITH CARDIOLOGIST FOR REFILL) 15 tablet 0   amLODipine  (NORVASC ) 5 MG tablet Take 1 tablet by mouth daily along with amlodipine  2.5 mg tablet to total 7.5 mg daily 15 tablet 0   certolizumab pegol  (CIMZIA ) 2 X 200 MG KIT 400mg      cyclobenzaprine  (FLEXERIL ) 5 MG tablet Take 5 mg by mouth daily as needed.     gabapentin (NEURONTIN) 100 MG capsule 100 mg 3 (three) times daily.     gabapentin (NEURONTIN) 300 MG capsule TAKE 1 CAPSULE BY MOUTH EVERYDAY AT BEDTIME     hydroxychloroquine  (PLAQUENIL ) 200 MG tablet  Take 200 mg by mouth 2 (two) times daily.     ibuprofen  (ADVIL ,MOTRIN ) 800 MG tablet Take 1 tablet (800 mg total) by mouth every 8 (eight) hours as needed for mild pain or moderate pain. 30 tablet 1   losartan  (COZAAR ) 50 MG tablet TAKE 1 TABLET EVERY DAY 90 tablet 3   metoprolol  succinate (TOPROL -XL) 25 MG 24 hr tablet Take 0.5 tablets (12.5 mg total) by mouth daily. Ok to take additional 1/2 tablet as needed for palpitations 15 tablet 0   naphazoline-pheniramine (VISINE) 0.025-0.3 % ophthalmic solution 1 drop into both eyes as needed for itching     omeprazole  (PRILOSEC) 20 MG capsule Take 1 capsule (20 mg total) by mouth daily. 30 capsule 0   Polyethyl Glycol-Propyl Glycol (SYSTANE OP) Place 1 drop into both eyes daily as needed (itching).     rosuvastatin  (CRESTOR ) 20 MG tablet TAKE 1 TABLET EVERY DAY 90 tablet 3   Multiple Vitamin (MULTIVITAMIN WITH MINERALS) TABS tablet Take 1 tablet by mouth every other day. (Patient not taking: Reported on 06/30/2023)     No facility-administered medications prior to visit.     Allergies:   Aspirin, Iodine, Naprosyn [naproxen], and Tramadol   Social History   Socioeconomic History   Marital status: Married    Spouse name: Not on file   Number of children: Not on file   Years of education: Not on file   Highest education level: Not on file  Occupational History   Not on file  Tobacco Use   Smoking status: Former    Types: Cigarettes   Smokeless tobacco: Never  Vaping Use   Vaping status: Never Used  Substance and Sexual Activity   Alcohol use: Yes    Comment: occ   Drug use: No   Sexual activity: Not on file  Other Topics Concern   Not on file  Social History Narrative   Not on file   Social Drivers of Health   Financial Resource Strain: Not on file  Food Insecurity: Not on file  Transportation Needs: Not on file  Physical Activity: Not on file  Stress: Not on  file  Social Connections: Not on file    Additional social history is  that she is married for 49 years.  She has 3 sons, 5 grandchildren, and 2 great-grandchildren.  I take care of he patient's husband who recently had valve surgery  Family History:  The patient's family history includes Alcoholism in her father; Diabetes in her brother, father, and paternal grandmother; Diabetes Mellitus II in her mother; Heart disease in her brother; Lung cancer in her maternal grandfather.   Her mother died at age 9 with heart failure.  She has 4 deceased brothers and 3 brothers are alive.  ROS General: Negative; No fevers, chills, or night sweats;  HEENT: Negative; No changes in vision or hearing, sinus congestion, difficulty swallowing Pulmonary: Negative; No cough, wheezing, shortness of breath, hemoptysis Cardiovascular: See history of present illness GI: Negative; No nausea, vomiting, diarrhea, or abdominal pain GU: Negative; No dysuria, hematuria, or difficulty voiding Musculoskeletal: Status post recent L3-L4 back surgery Hematologic/Oncology: Negative; no easy bruising, bleeding Endocrine: Negative; no heat/cold intolerance; no diabetes Neuro: Negative; no changes in balance, headaches Skin: Negative; No rashes or skin lesions Psychiatric: Negative; No behavioral problems, depression Sleep: Negative; No snoring, daytime sleepiness, hypersomnolence, bruxism, restless legs, hypnogognic hallucinations, no cataplexy Other comprehensive 14 point system review is negative.   PHYSICAL EXAM:   VS:  BP (!) 148/68   Pulse (!) 53   Ht 5\' 6"  (1.676 m)   Wt 150 lb (68 kg)   SpO2 98%   BMI 24.21 kg/m     Repeat blood pressure by me was 138/66.  Wt Readings from Last 3 Encounters:  06/30/23 150 lb (68 kg)  11/06/21 167 lb 9.6 oz (76 kg)  10/31/21 168 lb 9.6 oz (76.5 kg)    General: Alert, oriented, no distress.  Skin: normal turgor, no rashes, warm and dry HEENT: Normocephalic, atraumatic. Pupils equal round and reactive to light; sclera anicteric; extraocular  muscles intact;  Nose without nasal septal hypertrophy Mouth/Parynx benign; Mallinpatti scale 3 Neck: No JVD, no carotid bruits; normal carotid upstroke Lungs: clear to ausculatation and percussion; no wheezing or rales Chest wall: without tenderness to palpitation Heart: PMI not displaced, RRR, s1 s2 normal, 1-2/6 systolic murmur in the aortic area at the apex no audible diastolic murmur, no rubs, gallops, thrills, or heaves Abdomen: soft, nontender; no hepatosplenomehaly, BS+; abdominal aorta nontender and not dilated by palpation. Back: no CVA tenderness Pulses 2+ Musculoskeletal: full range of motion, normal strength, no joint deformities Extremities: no clubbing cyanosis or edema, Homan's sign negative  Neurologic: grossly nonfocal; Cranial nerves grossly wnl Psychologic: Normal mood and affect    Studies/Labs Reviewed:   EKG Interpretation Date/Time:  Wednesday June 30 2023 09:23:52 EDT Ventricular Rate:  53 PR Interval:  154 QRS Duration:  90 QT Interval:  464 QTC Calculation: 435 R Axis:   -31  Text Interpretation: Sinus bradycardia with sinus arrhythmia Left axis deviation Moderate voltage criteria for LVH, may be normal variant ( R in aVL , Cornell product ) Septal infarct , age undetermined When compared with ECG of 21-Jul-2018 08:50, Premature atrial complexes are no longer Present Septal infarct is now Present T wave amplitude has increased in Inferior leads T wave amplitude has increased in Lateral leads Confirmed by Magnus Schuller ([id number]) on 06/30/2023 10:49:13 AM    [place] Bounds 12/2021 ECG (independently read by me): Sinus bradycardia at 57 bpm.  Left bundle branch block with repolarization changes.  QTc interval 455 ms.  September 08, 2021  ECG (independently read by me): Sinus bradycardia at 49, LBBB  September 23, 2020 ECG (independently read by me): NSR at 65; LAD, LVH by voltage; no ectopy, normal intervals    Jun 11, 2020 ECG (independently read by me): NSR at 72; LVH by  voltage; QTc 468 msec  December 2020 ECG (independently read by me): Normal sinus rhythm at 63 bpm, LVH by voltage.  May 2018 ECG (independently read by me): normal sinus rhythm at 78 bpm.  Mild LVH by voltage criteria in aVL.  Normal intervals.  Recent Labs:    Latest Ref Rng & Units 07/08/2020    8:20 AM 07/21/2018    8:48 AM 09/14/2016    9:18 AM  BMP  Glucose 65 - 99 mg/dL 99  161  096   BUN 8 - 27 mg/dL 16  11  20    Creatinine 0.57 - 1.00 mg/dL 0.45  4.09  8.11   BUN/Creat Ratio 12 - 28 28   27    Sodium 134 - 144 mmol/L 145  140  142   Potassium 3.5 - 5.2 mmol/L 4.0  4.0  5.2   Chloride 96 - 106 mmol/L 109  108  106   CO2 20 - 29 mmol/L 22  26  24    Calcium  8.7 - 10.3 mg/dL 9.5  9.9  91.4         Latest Ref Rng & Units 07/08/2020    8:20 AM 09/14/2016    9:18 AM  Hepatic Function  Total Protein 6.0 - 8.5 g/dL 6.1  6.7   Albumin  3.7 - 4.7 g/dL 4.3  4.4   AST 0 - 40 IU/L 23  28   ALT 0 - 32 IU/L 20  30   Alk Phosphatase 44 - 121 IU/L 105  105   Total Bilirubin 0.0 - 1.2 mg/dL 0.2  0.3        Latest Ref Rng & Units 07/08/2020    8:20 AM 07/21/2018    8:48 AM 07/03/2016   11:54 PM  CBC  WBC 3.4 - 10.8 x10E3/uL 3.5  3.2  4.7   Hemoglobin 11.1 - 15.9 g/dL 78.2  95.6  21.3   Hematocrit 34.0 - 46.6 % 38.4  42.5  38.6   Platelets 150 - 450 x10E3/uL 158  140  158    Lab Results  Component Value Date   MCV 90 07/08/2020   MCV 93.4 07/21/2018   MCV 88.9 07/03/2016   Lab Results  Component Value Date   TSH 0.777 07/08/2020   No results found for: "HGBA1C"   BNP No results found for: "BNP"  ProBNP No results found for: "PROBNP"   Lipid Panel     Component Value Date/Time   CHOL 184 07/08/2020 0820   TRIG 46 07/08/2020 0820   HDL 95 07/08/2020 0820   CHOLHDL 1.9 07/08/2020 0820   LDLCALC 80 07/08/2020 0820     RADIOLOGY: No results found.   Additional studies/ records that were reviewed today include:  I reviewed the patient's records from [facility]  physicians  Echo Doppler from June 2018 was reviewed  ECHO: 07/15/2020 IMPRESSIONS   1. Left ventricular ejection fraction, by estimation, is 65 to 70%. The  left ventricle has normal function. The left ventricle has no regional  wall motion abnormalities. Left ventricular diastolic parameters are  indeterminate.   2. Right ventricular systolic function is normal. The right ventricular  size is normal. There is normal pulmonary artery systolic pressure.  3. The mitral valve is normal in structure. Trivial mitral valve  regurgitation. No evidence of mitral stenosis.   4. The aortic valve is tricuspid. Aortic valve regurgitation is mild.  Mild aortic valve sclerosis is present, with no evidence of aortic valve  stenosis.   5. The inferior vena cava is normal in size with greater than 50%  respiratory variability, suggesting right atrial pressure of 3 mmHg.   Comparison(s): No significant change from prior study.   Conclusion(s)/Recommendation(s): Otherwise normal echocardiogram, with  minor abnormalities described in the report.   ASSESSMENT:    1. Essential hypertension   2. Pure hypercholesterolemia   3. Hyperlipidemia LDL goal <100     PLAN:  Ms. Dontavia Brand is a 73 year-old female who admits to a long-standing history of a cardiac murmur.  In the [facility] records, there was mention of a possible bicuspid aortic valve.  A 2D echo Doppler study from June 2018 showed a structurally normal aortic valve.  There was no mention of a bicuspid valve.  She had mild aortic insufficiency.  There was no evidence for aortic valve stenosis or significant sclerosis.  She also had trace MR.  LV function was normal with EF estimated 60 to 65% with grade 1 diastolic dysfunction.  Over the last several years she required medication titration for more optimal blood pressure control.  When I saw her in May 2022 her blood pressure was elevated on amlodipine  7.5 mg.  With her previous demonstration of mild  diastolic dysfunction and mild blood pressure elevation at that time I recommended initiation of ARB therapy with losartan  and she has been titrated to 50 mg daily.  An echo Doppler study on July 23, 2020 continued to show hyperdynamic LV function without wall motion abnormalities.  There was evidence for mild aortic sclerosis without stenosis.  She had normal estimated RV systolic pressure.  She had recently experienced palpitations and on Zio patch monitoring but sinus rhythm with intermittent episodes of bundle branch block, and several episodes of patient triggered junctional rhythm with 5 short-lived episodes of SVT.  She has been on low-dose Toprol  XL currently at 12.5 mg which had been reduced from 25 mg.  Higher dosing he had significantly increased fatigability with heart rate in the upper 40s.  ECG today shows sinus bradycardia at 57 bpm with left bundle branch block.  Her blood pressure is stable on her current regimen of amlodipine  7.5 mg, losartan  50 mg and metoprolol  succinate 12.5 mg daily.  She has been evaluated by Dr. Marven Slimmer.  She sees Dr. Ritta Chessman for primary care and will be undergoing follow-up laboratory.  She continues to be on rosuvastatin  20 mg daily for hyperlipidemia.  LDL cholesterol last year was 80.  Cardiac murmur is stable.  I encouraged her continued exercise and activity.  As long as she stable I will see her in 1 year for reevaluation or sooner as needed.    Medication Adjustments/Labs and Tests Ordered: Current medicines are reviewed at length with the patient today.  Concerns regarding medicines are outlined above.  Medication changes, Labs and Tests ordered today are listed in the Patient Instructions below. There are no Patient Instructions on file for this visit.   Signed, Magnus Schuller, MD  06/30/2023 10:48 AM    [facility] [address], [place], [place]  [address] Phone: [phone]

## 2023-07-02 ENCOUNTER — Encounter: Payer: Self-pay | Admitting: Cardiovascular Disease

## 2023-07-02 ENCOUNTER — Ambulatory Visit
Admission: RE | Admit: 2023-07-02 | Discharge: 2023-07-02 | Disposition: A | Discharge status: Home / Self Care | Source: Ambulatory Visit | Attending: Family Medicine | Admitting: Family Medicine

## 2023-07-02 DIAGNOSIS — Z1231 Encounter for screening mammogram for malignant neoplasm of breast: Secondary | ICD-10-CM | POA: Diagnosis not present

## 2023-07-06 DIAGNOSIS — M0579 Rheumatoid arthritis with rheumatoid factor of multiple sites without organ or systems involvement: Secondary | ICD-10-CM | POA: Diagnosis not present

## 2023-07-06 DIAGNOSIS — M1991 Primary osteoarthritis, unspecified site: Secondary | ICD-10-CM | POA: Diagnosis not present

## 2023-07-06 DIAGNOSIS — M79642 Pain in left hand: Secondary | ICD-10-CM | POA: Diagnosis not present

## 2023-07-06 DIAGNOSIS — M79641 Pain in right hand: Secondary | ICD-10-CM | POA: Diagnosis not present

## 2023-07-06 DIAGNOSIS — Z6823 Body mass index (BMI) 23.0-23.9, adult: Secondary | ICD-10-CM | POA: Diagnosis not present

## 2023-07-06 DIAGNOSIS — Z79899 Other long term (current) drug therapy: Secondary | ICD-10-CM | POA: Diagnosis not present

## 2023-07-14 ENCOUNTER — Telehealth: Payer: Self-pay | Admitting: Cardiovascular Disease

## 2023-07-14 MED ORDER — METOPROLOL SUCCINATE ER 25 MG PO TB24: 12.5000 mg | ORAL_TABLET | Freq: Every day | ORAL | 3 refills | Status: AC

## 2023-07-14 NOTE — Telephone Encounter (Signed)
 Pt's medication was sent to pt's pharmacy as requested. Confirmation received.

## 2023-07-14 NOTE — Telephone Encounter (Signed)
*  STAT* If patient is at the pharmacy, call can be transferred to refill team.   1. Which medications need to be refilled? (please list name of each medication and dose if known)  metoprolol  succinate (TOPROL -XL) 25 MG 24 hr tablet    2. Would you like to learn more about the convenience, safety, & potential cost savings by using the [facility]? N/A     3. Are you open to using the [facility] (Type [facility]. N/A   4. Which pharmacy/location (including street and city if local pharmacy) is medication to be sent to? [facility] - [place], [place] - [address]   5. Do they need a 30 day or 90 day supply? 90 day  Only has a few tablets left.

## 2023-07-22 DIAGNOSIS — M0579 Rheumatoid arthritis with rheumatoid factor of multiple sites without organ or systems involvement: Secondary | ICD-10-CM | POA: Diagnosis not present

## 2023-07-26 ENCOUNTER — Other Ambulatory Visit: Payer: Self-pay | Admitting: *Deleted

## 2023-07-26 MED ORDER — ROSUVASTATIN CALCIUM 20 MG PO TABS: 20.0000 mg | ORAL_TABLET | Freq: Every day | ORAL | 3 refills | Status: AC

## 2023-07-26 MED ORDER — LOSARTAN POTASSIUM 50 MG PO TABS: 50.0000 mg | ORAL_TABLET | Freq: Every day | ORAL | 3 refills | Status: AC

## 2023-07-26 NOTE — Addendum Note (Signed)
 Addended by: MEMORY DELON POUR on: 07/26/2023 12:16 PM   Modules accepted: Orders

## 2023-08-19 DIAGNOSIS — M0579 Rheumatoid arthritis with rheumatoid factor of multiple sites without organ or systems involvement: Secondary | ICD-10-CM | POA: Diagnosis not present

## 2023-09-16 DIAGNOSIS — R5383 Other fatigue: Secondary | ICD-10-CM | POA: Diagnosis not present

## 2023-09-16 DIAGNOSIS — Z111 Encounter for screening for respiratory tuberculosis: Secondary | ICD-10-CM | POA: Diagnosis not present

## 2023-09-16 DIAGNOSIS — M0579 Rheumatoid arthritis with rheumatoid factor of multiple sites without organ or systems involvement: Secondary | ICD-10-CM | POA: Diagnosis not present

## 2023-10-06 DIAGNOSIS — H40013 Open angle with borderline findings, low risk, bilateral: Secondary | ICD-10-CM | POA: Diagnosis not present

## 2023-10-06 DIAGNOSIS — H25813 Combined forms of age-related cataract, bilateral: Secondary | ICD-10-CM | POA: Diagnosis not present

## 2023-10-06 DIAGNOSIS — Z79899 Other long term (current) drug therapy: Secondary | ICD-10-CM | POA: Diagnosis not present

## 2023-10-06 DIAGNOSIS — H35033 Hypertensive retinopathy, bilateral: Secondary | ICD-10-CM | POA: Diagnosis not present

## 2023-10-14 DIAGNOSIS — M0579 Rheumatoid arthritis with rheumatoid factor of multiple sites without organ or systems involvement: Secondary | ICD-10-CM | POA: Diagnosis not present

## 2023-11-11 DIAGNOSIS — M0579 Rheumatoid arthritis with rheumatoid factor of multiple sites without organ or systems involvement: Secondary | ICD-10-CM | POA: Diagnosis not present

## 2023-12-09 DIAGNOSIS — M0579 Rheumatoid arthritis with rheumatoid factor of multiple sites without organ or systems involvement: Secondary | ICD-10-CM | POA: Diagnosis not present

## 2024-01-04 DIAGNOSIS — Z79899 Other long term (current) drug therapy: Secondary | ICD-10-CM | POA: Diagnosis not present

## 2024-01-04 DIAGNOSIS — M0579 Rheumatoid arthritis with rheumatoid factor of multiple sites without organ or systems involvement: Secondary | ICD-10-CM | POA: Diagnosis not present

## 2024-01-04 DIAGNOSIS — M1991 Primary osteoarthritis, unspecified site: Secondary | ICD-10-CM | POA: Diagnosis not present

## 2024-01-04 DIAGNOSIS — Z6821 Body mass index (BMI) 21.0-21.9, adult: Secondary | ICD-10-CM | POA: Diagnosis not present

## 2024-01-06 DIAGNOSIS — M0579 Rheumatoid arthritis with rheumatoid factor of multiple sites without organ or systems involvement: Secondary | ICD-10-CM | POA: Diagnosis not present
